# Patient Record
Sex: Female | Born: 1935 | Race: White | Hispanic: No | Marital: Married | Smoking: Former smoker
Health system: Southern US, Community
[De-identification: ages and names within clinical notes are randomized; demographics above are authoritative.]

## PROBLEM LIST (undated history)

## (undated) DIAGNOSIS — E079 Disorder of thyroid, unspecified: Secondary | ICD-10-CM

## (undated) DIAGNOSIS — T7840XA Allergy, unspecified, initial encounter: Secondary | ICD-10-CM

## (undated) DIAGNOSIS — I639 Cerebral infarction, unspecified: Secondary | ICD-10-CM

## (undated) DIAGNOSIS — I10 Essential (primary) hypertension: Secondary | ICD-10-CM

## (undated) HISTORY — DX: Essential (primary) hypertension: I10

## (undated) HISTORY — DX: Allergy, unspecified, initial encounter: T78.40XA

## (undated) HISTORY — DX: Cerebral infarction, unspecified: I63.9

## (undated) HISTORY — DX: Disorder of thyroid, unspecified: E07.9

---

## 1999-08-08 ENCOUNTER — Ambulatory Visit (HOSPITAL_COMMUNITY): Admission: RE | Admit: 1999-08-08 | Discharge: 1999-08-08 | Payer: Self-pay | Admitting: *Deleted

## 1999-09-30 ENCOUNTER — Encounter: Admission: RE | Admit: 1999-09-30 | Discharge: 1999-09-30 | Payer: Self-pay | Admitting: Orthopedic Surgery

## 2001-07-30 ENCOUNTER — Encounter: Payer: Self-pay | Admitting: Specialist

## 2001-08-06 ENCOUNTER — Inpatient Hospital Stay (HOSPITAL_COMMUNITY): Admission: RE | Admit: 2001-08-06 | Discharge: 2001-08-11 | Payer: Self-pay | Admitting: Specialist

## 2002-05-28 ENCOUNTER — Ambulatory Visit (HOSPITAL_COMMUNITY): Admission: RE | Admit: 2002-05-28 | Discharge: 2002-05-28 | Payer: Self-pay | Admitting: *Deleted

## 2002-07-17 ENCOUNTER — Encounter: Payer: Self-pay | Admitting: Specialist

## 2002-07-17 ENCOUNTER — Encounter: Admission: RE | Admit: 2002-07-17 | Discharge: 2002-07-17 | Payer: Self-pay | Admitting: *Deleted

## 2002-08-14 ENCOUNTER — Encounter: Admission: RE | Admit: 2002-08-14 | Discharge: 2002-08-14 | Payer: Self-pay | Admitting: Specialist

## 2002-08-14 ENCOUNTER — Encounter: Payer: Self-pay | Admitting: Specialist

## 2002-08-28 ENCOUNTER — Encounter: Payer: Self-pay | Admitting: Specialist

## 2002-08-28 ENCOUNTER — Encounter: Admission: RE | Admit: 2002-08-28 | Discharge: 2002-08-28 | Payer: Self-pay | Admitting: Specialist

## 2002-09-08 ENCOUNTER — Encounter: Admission: RE | Admit: 2002-09-08 | Discharge: 2002-09-08 | Payer: Self-pay | Admitting: Specialist

## 2002-09-08 ENCOUNTER — Encounter: Payer: Self-pay | Admitting: Specialist

## 2003-02-27 ENCOUNTER — Encounter: Admission: RE | Admit: 2003-02-27 | Discharge: 2003-02-27 | Payer: Self-pay | Admitting: Anesthesiology

## 2003-11-04 ENCOUNTER — Encounter: Admission: RE | Admit: 2003-11-04 | Discharge: 2003-11-04 | Payer: Self-pay | Admitting: Orthopedic Surgery

## 2004-01-12 ENCOUNTER — Inpatient Hospital Stay (HOSPITAL_COMMUNITY): Admission: RE | Admit: 2004-01-12 | Discharge: 2004-01-15 | Payer: Self-pay | Admitting: Orthopedic Surgery

## 2007-01-30 ENCOUNTER — Encounter: Admission: RE | Admit: 2007-01-30 | Discharge: 2007-01-30 | Payer: Self-pay | Admitting: Internal Medicine

## 2007-02-19 ENCOUNTER — Ambulatory Visit: Payer: Self-pay | Admitting: Vascular Surgery

## 2007-08-14 ENCOUNTER — Encounter: Admission: RE | Admit: 2007-08-14 | Discharge: 2007-08-14 | Payer: Self-pay | Admitting: Gastroenterology

## 2009-11-05 ENCOUNTER — Encounter (HOSPITAL_BASED_OUTPATIENT_CLINIC_OR_DEPARTMENT_OTHER): Admission: RE | Admit: 2009-11-05 | Discharge: 2009-12-21 | Payer: Self-pay | Admitting: Internal Medicine

## 2009-12-22 ENCOUNTER — Encounter (HOSPITAL_BASED_OUTPATIENT_CLINIC_OR_DEPARTMENT_OTHER): Admission: RE | Admit: 2009-12-22 | Discharge: 2010-03-22 | Payer: Self-pay | Admitting: Internal Medicine

## 2010-02-14 ENCOUNTER — Inpatient Hospital Stay (HOSPITAL_COMMUNITY): Admission: EM | Admit: 2010-02-14 | Discharge: 2010-02-17 | Payer: Self-pay | Admitting: Emergency Medicine

## 2010-02-15 ENCOUNTER — Encounter (INDEPENDENT_AMBULATORY_CARE_PROVIDER_SITE_OTHER): Payer: Self-pay | Admitting: Internal Medicine

## 2010-02-15 ENCOUNTER — Ambulatory Visit: Payer: Self-pay | Admitting: Physical Medicine & Rehabilitation

## 2010-02-16 ENCOUNTER — Encounter (INDEPENDENT_AMBULATORY_CARE_PROVIDER_SITE_OTHER): Payer: Self-pay | Admitting: Internal Medicine

## 2010-02-16 ENCOUNTER — Encounter (INDEPENDENT_AMBULATORY_CARE_PROVIDER_SITE_OTHER): Payer: Self-pay | Admitting: Cardiology

## 2010-02-17 ENCOUNTER — Inpatient Hospital Stay (HOSPITAL_COMMUNITY)
Admission: RE | Admit: 2010-02-17 | Discharge: 2010-03-10 | Payer: Self-pay | Admitting: Physical Medicine & Rehabilitation

## 2010-02-17 ENCOUNTER — Ambulatory Visit: Payer: Self-pay | Admitting: Physical Medicine & Rehabilitation

## 2010-04-14 ENCOUNTER — Encounter
Admission: RE | Admit: 2010-04-14 | Discharge: 2010-07-13 | Payer: Self-pay | Admitting: Physical Medicine & Rehabilitation

## 2010-04-18 ENCOUNTER — Ambulatory Visit: Payer: Self-pay | Admitting: Physical Medicine & Rehabilitation

## 2010-04-29 ENCOUNTER — Encounter
Admission: RE | Admit: 2010-04-29 | Discharge: 2010-07-28 | Payer: Self-pay | Admitting: Physical Medicine & Rehabilitation

## 2010-05-17 ENCOUNTER — Ambulatory Visit: Payer: Self-pay | Admitting: Physical Medicine & Rehabilitation

## 2010-06-14 ENCOUNTER — Ambulatory Visit: Payer: Self-pay | Admitting: Physical Medicine & Rehabilitation

## 2010-07-20 ENCOUNTER — Encounter
Admission: RE | Admit: 2010-07-20 | Discharge: 2010-07-26 | Payer: Self-pay | Admitting: Physical Medicine & Rehabilitation

## 2010-07-26 ENCOUNTER — Ambulatory Visit: Payer: Self-pay | Admitting: Physical Medicine & Rehabilitation

## 2010-07-29 ENCOUNTER — Encounter
Admission: RE | Admit: 2010-07-29 | Discharge: 2010-09-12 | Payer: Self-pay | Admitting: Physical Medicine & Rehabilitation

## 2010-10-20 ENCOUNTER — Encounter
Admission: RE | Admit: 2010-10-20 | Discharge: 2010-12-27 | Payer: Self-pay | Source: Home / Self Care | Attending: Physical Medicine & Rehabilitation | Admitting: Physical Medicine & Rehabilitation

## 2010-10-25 ENCOUNTER — Ambulatory Visit: Payer: Self-pay | Admitting: Physical Medicine & Rehabilitation

## 2010-12-22 ENCOUNTER — Encounter
Admission: RE | Admit: 2010-12-22 | Discharge: 2010-12-27 | Payer: Self-pay | Source: Home / Self Care | Attending: Physical Medicine & Rehabilitation | Admitting: Physical Medicine & Rehabilitation

## 2010-12-27 ENCOUNTER — Ambulatory Visit
Admission: RE | Admit: 2010-12-27 | Discharge: 2010-12-27 | Payer: Self-pay | Source: Home / Self Care | Attending: Physical Medicine & Rehabilitation | Admitting: Physical Medicine & Rehabilitation

## 2011-03-09 LAB — URINALYSIS, ROUTINE W REFLEX MICROSCOPIC
Bilirubin Urine: NEGATIVE
Glucose, UA: NEGATIVE mg/dL
Ketones, ur: NEGATIVE mg/dL
Leukocytes, UA: NEGATIVE
Nitrite: NEGATIVE
Protein, ur: 30 mg/dL — AB
Specific Gravity, Urine: 1.018 (ref 1.005–1.030)
Urobilinogen, UA: 0.2 mg/dL (ref 0.0–1.0)
pH: 7 (ref 5.0–8.0)

## 2011-03-09 LAB — CBC
HCT: 41.3 % (ref 36.0–46.0)
Hemoglobin: 14.3 g/dL (ref 12.0–15.0)
MCHC: 34.6 g/dL (ref 30.0–36.0)
MCV: 93.5 fL (ref 78.0–100.0)
Platelets: 209 10*3/uL (ref 150–400)
RBC: 4.42 MIL/uL (ref 3.87–5.11)
RDW: 12.6 % (ref 11.5–15.5)
WBC: 7.4 10*3/uL (ref 4.0–10.5)

## 2011-03-09 LAB — URINE MICROSCOPIC-ADD ON

## 2011-03-09 LAB — URINE CULTURE
Colony Count: NO GROWTH
Culture: NO GROWTH

## 2011-03-09 LAB — POCT CARDIAC MARKERS
CKMB, poc: 2.1 ng/mL (ref 1.0–8.0)
Myoglobin, poc: 67.1 ng/mL (ref 12–200)
Troponin i, poc: <0.05 ng/mL (ref 0.00–0.09)

## 2011-03-09 LAB — POCT I-STAT, CHEM 8
BUN: 18 mg/dL (ref 6–23)
Calcium, Ion: 1.2 mmol/L (ref 1.12–1.32)
Chloride: 102 mEq/L (ref 96–112)
Creatinine, Ser: 0.5 mg/dL (ref 0.4–1.2)
Glucose, Bld: 154 mg/dL — ABNORMAL HIGH (ref 70–99)
HCT: 42 % (ref 36.0–46.0)
Hemoglobin: 14.3 g/dL (ref 12.0–15.0)
Potassium: 3.4 mEq/L — ABNORMAL LOW (ref 3.5–5.1)
Sodium: 141 mEq/L (ref 135–145)
TCO2: 28 mmol/L (ref 0–100)

## 2011-03-09 LAB — DIFFERENTIAL
Basophils Absolute: 0 10*3/uL (ref 0.0–0.1)
Basophils Relative: 0 % (ref 0–1)
Eosinophils Absolute: 0 10*3/uL (ref 0.0–0.7)
Eosinophils Relative: 0 % (ref 0–5)
Lymphocytes Relative: 7 % — ABNORMAL LOW (ref 12–46)
Lymphs Abs: 0.5 10*3/uL — ABNORMAL LOW (ref 0.7–4.0)
Monocytes Absolute: 0.1 10*3/uL (ref 0.1–1.0)
Monocytes Relative: 1 % — ABNORMAL LOW (ref 3–12)
Neutro Abs: 6.7 10*3/uL (ref 1.7–7.7)
Neutrophils Relative %: 91 % — ABNORMAL HIGH (ref 43–77)

## 2011-03-09 LAB — PROTIME-INR
INR: 0.97 (ref 0.00–1.49)
Prothrombin Time: 12.8 seconds (ref 11.6–15.2)

## 2011-03-13 LAB — LIPID PANEL
Cholesterol: 166 mg/dL (ref 0–200)
HDL: 48 mg/dL (ref >39)
LDL Cholesterol: 102 mg/dL — ABNORMAL HIGH (ref 0–99)
Total CHOL/HDL Ratio: 3.5 RATIO
Triglycerides: 81 mg/dL (ref <150)
VLDL: 16 mg/dL (ref 0–40)

## 2011-03-13 LAB — BASIC METABOLIC PANEL
BUN: 10 mg/dL (ref 6–23)
BUN: 23 mg/dL (ref 6–23)
BUN: 24 mg/dL — ABNORMAL HIGH (ref 6–23)
CO2: 27 mEq/L (ref 19–32)
CO2: 30 mEq/L (ref 19–32)
Calcium: 9.9 mg/dL (ref 8.4–10.5)
Chloride: 102 mEq/L (ref 96–112)
Chloride: 103 mEq/L (ref 96–112)
Chloride: 108 mEq/L (ref 96–112)
Creatinine, Ser: 0.85 mg/dL (ref 0.4–1.2)
GFR calc Af Amer: >60 mL/min (ref >60)
GFR calc non Af Amer: >60 mL/min (ref >60)
Glucose, Bld: 116 mg/dL — ABNORMAL HIGH (ref 70–99)
Potassium: 3.1 mEq/L — ABNORMAL LOW (ref 3.5–5.1)
Potassium: 3.7 mEq/L (ref 3.5–5.1)
Potassium: 4.1 mEq/L (ref 3.5–5.1)
Sodium: 139 mEq/L (ref 135–145)

## 2011-03-13 LAB — DIFFERENTIAL
Basophils Absolute: 0 10*3/uL (ref 0.0–0.1)
Lymphocytes Relative: 31 % (ref 12–46)
Lymphs Abs: 2.1 10*3/uL (ref 0.7–4.0)
Monocytes Absolute: 0.6 10*3/uL (ref 0.1–1.0)
Monocytes Relative: 9 % (ref 3–12)
Neutro Abs: 3.8 10*3/uL (ref 1.7–7.7)

## 2011-03-13 LAB — CBC
HCT: 37.5 % (ref 36.0–46.0)
MCHC: 35.5 g/dL (ref 30.0–36.0)
MCV: 92.2 fL (ref 78.0–100.0)
Platelets: 174 10*3/uL (ref 150–400)
RBC: 4.06 MIL/uL (ref 3.87–5.11)
RDW: 12.3 % (ref 11.5–15.5)
WBC: 6.6 10*3/uL (ref 4.0–10.5)

## 2011-03-13 LAB — URINE MICROSCOPIC-ADD ON

## 2011-03-13 LAB — URINALYSIS, ROUTINE W REFLEX MICROSCOPIC
Bilirubin Urine: NEGATIVE
Glucose, UA: NEGATIVE mg/dL
Glucose, UA: NEGATIVE mg/dL
Hgb urine dipstick: NEGATIVE
Ketones, ur: NEGATIVE mg/dL
Ketones, ur: NEGATIVE mg/dL
Nitrite: NEGATIVE
Protein, ur: NEGATIVE mg/dL
Specific Gravity, Urine: 1.012 (ref 1.005–1.030)
Urobilinogen, UA: 0.2 mg/dL (ref 0.0–1.0)
pH: 6.5 (ref 5.0–8.0)
pH: 7 (ref 5.0–8.0)

## 2011-03-13 LAB — TSH
TSH: 0.055 u[IU]/mL — ABNORMAL LOW (ref 0.350–4.500)
TSH: 0.267 u[IU]/mL — ABNORMAL LOW (ref 0.350–4.500)

## 2011-03-13 LAB — URINE CULTURE: Colony Count: 80000

## 2011-03-13 LAB — COMPREHENSIVE METABOLIC PANEL
Albumin: 3.3 g/dL — ABNORMAL LOW (ref 3.5–5.2)
BUN: 8 mg/dL (ref 6–23)
Calcium: 8.9 mg/dL (ref 8.4–10.5)
Creatinine, Ser: 0.59 mg/dL (ref 0.4–1.2)
GFR calc Af Amer: >60 mL/min (ref >60)
Total Bilirubin: 1 mg/dL (ref 0.3–1.2)
Total Protein: 5.7 g/dL — ABNORMAL LOW (ref 6.0–8.3)

## 2011-03-13 LAB — HEMOGLOBIN A1C
Hgb A1c MFr Bld: 5.7 % (ref 4.6–6.1)
Mean Plasma Glucose: 117 mg/dL

## 2011-03-13 LAB — HOMOCYSTEINE: Homocysteine: 9.5 umol/L (ref 4.0–15.4)

## 2011-03-13 LAB — TROPONIN I: Troponin I: 0.1 ng/mL — ABNORMAL HIGH (ref 0.00–0.06)

## 2011-03-13 LAB — VITAMIN B12: Vitamin B-12: 528 pg/mL (ref 211–911)

## 2011-04-07 ENCOUNTER — Encounter: Payer: Medicare Other | Attending: Physical Medicine & Rehabilitation

## 2011-04-07 ENCOUNTER — Ambulatory Visit (HOSPITAL_BASED_OUTPATIENT_CLINIC_OR_DEPARTMENT_OTHER): Payer: Medicare Other | Admitting: Physical Medicine & Rehabilitation

## 2011-04-07 DIAGNOSIS — I69993 Ataxia following unspecified cerebrovascular disease: Secondary | ICD-10-CM | POA: Insufficient documentation

## 2011-04-07 DIAGNOSIS — I6330 Cerebral infarction due to thrombosis of unspecified cerebral artery: Secondary | ICD-10-CM

## 2011-04-07 DIAGNOSIS — N3289 Other specified disorders of bladder: Secondary | ICD-10-CM | POA: Insufficient documentation

## 2011-04-10 NOTE — Assessment & Plan Note (Signed)
CHIEF COMPLAINT:  Problems walking.  This is a 77 year old female who had a right cerebellar stroke on February 14, 2010, right hemiataxia chronic, last evaluated by myself on December 27, 2010.  She has completed inpatient rehab, home health rehab, and outpatient therapy.  She is pursuing a motorized scooter.  She still needs assist with meal prep, shopping, bathing, dressing, and toileting. She is retired, lives with her husband with her daughter that assists her.  Blood pressures have been mainly in 150 range.  Her husband showed me this, states that he discussed this with Dr. Jacky Kindle, but no change in treatment plan were pursued.  Blood pressure 151/67, pulse 50, respirations 18, and sat 96% on room air.  This is an elderly female in no acute distress, orientation x3. Her speech is mildly dysarthric with some ataxia, dysarthria in right upper extremity, moderate dysmetria finger-nose-finger.  Motor strength is 5- in the right deltoid biceps, triceps, and grip and on the left 5/5.  In the lower extremity, she is 4-/5 strength.  She has dysmetria in the right lower extremity.  Sensation is intact.  Orientation to person, place, and year, but not month.  IMPRESSION: 1. Cerebellar stroke right sided, residual right hemiataxia, truncal     ataxia.  She has plateaued. 2. Spastic bladder.  Ditropan b.i.d.  We will increase to t.i.d. due     to occasional incontinent episodes.  I will see her back in about 4     months.     Erick Colace, M.D. Electronically Signed    AEK/MedQ D:  04/07/2011 17:23:27  T:  04/08/2011 06:24:45  Job #:  [id number]  cc:   Geoffry Paradise, M.D. Fax: [phone]

## 2011-05-05 NOTE — Discharge Summary (Signed)
NAME:  Jillian Gay, Jillian Gay                         ACCOUNT NO.:  [id number]   MEDICAL RECORD NO.:  [id number]                   PATIENT TYPE:  INP   LOCATION:  0477                                 FACILITY:  [facility]   PHYSICIAN:  Madlyn Frankel. Charlann Boxer, M.D.               DATE OF BIRTH:  1932/12/14   DATE OF ADMISSION:  01/12/2004  DATE OF DISCHARGE:  01/15/2004                                 DISCHARGE SUMMARY   ADMISSION DIAGNOSES:  1. Osteoarthritis of the left knee in the medial compartment.  2. Hypothyroidism.  3. Anxiety.   DISCHARGE DIAGNOSES:  1. Osteoarthritis of the left knee in the medial compartment, status post     left unicompartmental knee replacement.  2. Hypothyroidism.  3. Anxiety.  4. Postoperative hemorrhage anemia, stable at the time of discharge.   PROCEDURES:  The patient was taken to the operating room on January 12, 2004, and underwent a left unicompartmental knee replacement in the medial  compartment.  Surgeon was Dr. Durene Romans.  Assistant was [employer],  P.A.-C.  Surgery was done under general anesthesia, and a Hemovac drain x1  was placed at the time of surgery.   CONSULTS:  1. Physical therapy.  2. Occupational therapy.  3. Social work case Insurance account manager.   BRIEF HISTORY:  The patient is a 77 year old female with a long-standing  history of left knee pain.  She had previously undergone a right total knee  replacement in August of 2002.  She had some continued pain with the right  total knee; however, she is at the point now where her left knee is worse  than her right knee, and she is constantly in pain.  She has already been on  pain medication.  She is having difficulty getting around and doing her  activities of daily living.  X-rays revealed bone-on-bone arthritis in the  medial compartment of the left knee.  Dr. Charlann Boxer felt it was best at this  point to proceed with an unicompartmental knee arthroplasty.  The patient  agrees.  Risks and benefits of  the surgery were discussed with the patient,  and the patient wished to proceed.   LABORATORY DATA:  CBC on admission showed a hemoglobin of 14.3, hematocrit  41.6, white blood cell count 5.8, red blood cell count 4.47.  Serial  hemoglobins and hematocrits were followed throughout the hospital stay.  Hemoglobin and hematocrit did decline to 11.9 and 34.5 on January 15, 2004,  but were stable at the time of discharge.  Differential on admission was all  within normal limits.  Coagulation studies on admission were all within  normal limits.  PT and INR were 14.4 and 1.2 respectively on Coumadin  therapy.  Routine chemistry on admission showed glucose high at 120.  Serial  chemistries were followed throughout the hospital stay.  Glucose did range  from 120 on January 06, 2004, to a high of  150 on January 13, 2004.  Urinalysis on admission was all within normal limits.  The patient's blood  type is O negative with antibody screen negative.  EKG on admission showed  normal sinus rhythm.  I do not see any preoperative knee x-rays or chest x-  ray.   HOSPITAL COURSE:  The patient was admitted to the [facility] and  taken to the operating room where she underwent the above-stated procedure  without complications.  The patient tolerated the procedure well, and was  taken to the recovery room and the orthopedic floor to continue  postoperative care.   On postoperative day #1, January 13, 2004, the patient complained of pain in  the left knee, as expected.  Hemoglobin and hematocrit were 12.8 and 37.5.  She was neurovascularly intact to the left lower extremity.  She had a T-max  of 101.3.  Dressing was clean, dry, and intact.  The patient continued  working with physical therapy and occupational therapy.  Hemoglobin was  discontinued on this day.  PCA was also discontinued on this day, and IV was  heplocked.   On postoperative day #2, January 14, 2004, the patient was doing okay.   The  left knee incision was clean, dry, and intact.  The patient was to continue  working with physical therapy and occupational therapy, for planned  discharge the following day with home health physical therapy.   On postoperative day #3, January 15, 2004, the patient was doing well, and  was ready for discharge.  Hemoglobin and hematocrit were 11.9 and 34.5.  The  incision was clean, dry, and intact.  She was neurovascularly intact to the  left lower extremity.  T max was 99.8.  The patient was discharged home on  this date.   DISPOSITION:  The patient was discharged home on January 15, 2004.   DISCHARGE MEDICATIONS:  1. Vicodin, #50, 1-2 p.o. q.4-6h. p.r.n. pain.  2. Robaxin 500 mg, #50, one p.o. q.6-8h. p.r.n. spasm.  3. Coumadin per pharmacy protocol.   DIET:  As tolerated.   ACTIVITY:  The patient is weightbearing as tolerated to the left lower  extremity.  [facility] for home care.  Total knee precautions.   WOUND CARE:  The patient is to perform daily dressing changes until no  drainage.  She may shower on postoperative day #5, or when no drainage,  whichever is later.   FOLLOW UP:  The patient is to follow up with Dr. Charlann Boxer 2 weeks from the day  of surgery.  The office should be called for an appointment at [phone].   CONDITION ON DISCHARGE:  Stable and improved.     Clarene Reamer, P.A.-C.                   Madlyn Frankel Charlann Boxer, M.D.    SW/MEDQ  D:  02/05/2004  T:  02/06/2004  Job:  [id number]

## 2011-05-05 NOTE — Op Note (Signed)
NAME:  Jillian Gay, Jillian Gay                         ACCOUNT NO.:  [id number]   MEDICAL RECORD NO.:  [id number]                   PATIENT TYPE:  INP   LOCATION:  X003                                 FACILITY:  [facility]   PHYSICIAN:  Madlyn Frankel. Charlann Boxer, M.D.               DATE OF BIRTH:  1931/12/29   DATE OF PROCEDURE:  01/12/2004  DATE OF DISCHARGE:                                 OPERATIVE REPORT   PREOPERATIVE DIAGNOSIS:  Left knee medial compartment osteoarthritis.   POSTOPERATIVE DIAGNOSIS:  Left knee medial compartment osteoarthritis.   PROCEDURE:  Left knee unicompartmental knee replacement.   COMPONENTS USED:  Biomet Oxford knee system with a size medium femur, a size  left medium tibial tray, a size 3 meniscal bearing polyethylene insert.   SURGEON:  Madlyn Frankel. Charlann Boxer, M.D.   ASSISTANT:  Clarene Reamer, P.A.-C.   ANESTHESIA:  General.   ESTIMATED BLOOD LOSS:  Minimal.   TOURNIQUET TIME:  100 minutes.   DRAINS:  x1.   COMPLICATIONS:  None apparent.   INDICATION FOR PROCEDURE:  Ms. Augustine is a 77 year old white female who is  followed in the office for left knee pain associated with medial compartment  degenerative changes.  After reviewing extensively her disease pathology and  obtaining stress radiographs and reviewing with her procedural options, she  consents for left knee unicompartmental knee arthroplasty.   PROCEDURE IN DETAIL:  The patient was brought to the operative theater.  Once adequate anesthesia and preoperative antibiotics were administered, the  patient was positioned supine.  The right leg, the nonoperative leg, was  positioned into a gynecologic stirrup and abducted and flexed out of the  way.  The left lower extremity was then placed in the leg holder with the  foot of the bed dropped.  The left lower extremity was then prepped and  draped in sterile fashion.  The leg was exsanguinated, the tourniquet  elevated.  A paramidline incision was made extending  from the superior pole  of the patella to the level of the tibial tubercle.  A medial parapatellar  arthrotomy was made at that level as well.  Medial soft tissues were  stripped off of the proximal and medial anterior aspect of the tibia to  allow for placement of tibial cutting jig.  Following exposure, osteophytes  were removed off the medial femoral condyle as well as off of the  intercondylar notch region.  At this point a proximal tibial cut was made  using the extramedullary guide, first using a reciprocating saw in the level  of the notch, aimed toward the femoral head and about one-third or halfway  up the intercondylar spine.  This was followed by an oscillating saw that  removed a wafer of bone that measured about 2-3 mm in depth.  This wafer of  bone was then sized and determined to be a size medium.  At this point the  posterior horn of the medial meniscus was excised.  At this point the  femoral rod was placed into the femoral canal per protocol.  This would be  used as a guide for position of later jigs.  The femoral preparation was  then carried out with the tibial tray and femoral cutting jig.  Drill holes  were placed, assuring that it was parallel to the intramedullary rod,  parallel to the tibial cut surface, and well-positioned in the medial and  lateral plane of the medial femoral condyle.  Once this was confirmed, the  drill holes were made.  At this point the posterior femoral cut was made  with the jig.  Further debridement of posterior soft tissues was carried out  at this point.  Following this initial milling was carried out with a 0  spigot.  Following this the trial components were placed and with the knee  in 90 degrees of flexion, the flexion gap was noted to be a size 3 with the  knee in 20 degrees of flexion and was noted to be a size 1 gap.  At this  point based on the protocol, a size 2 spigot was placed and milling  repeated.  Bony prominences were  debrided as per protocol using an  osteotome.  A trial reduction was then carried out with a medium tibia,  medium femur, and size 3 poly.  The knee came to full extension in flexion  with good stability and preservation of the ligaments.  At this point trial  components were removed.  The knee was copiously irrigated with normal  saline solution.  Final components were brought to the field.  The final  components were cemented in in a two-cement technique, first with the tibial  component.  Note that prior to this the final tibial preparation was carried  out using the reciprocating saw and the trial to allow for the tibial keel.  This tibial component was cemented into position with the trial femoral  component and the trial poly placed with the knee held in 45 degrees  flexion.  Excessive cement was removed.  Once the cement had cured, a second  batch of cement was mixed for the femoral component.  The femoral component  was cemented into position, excessive cement removed, and the knee was held  again at 45 degrees and pressure applied.  Once the cement had cured, the  excessive cement was removed and the knee was re-examined for any foreign  body and a final size 3 meniscal bearing prosthesis then inserted into the  knee.  The knee came to full extension in flexion at 120, limited by the  knee and leg holder.  The knee was again irrigated, a medium Hemovac was  placed deep, and the extensor mechanism was reapproximated using #1 PDS,  followed by 2-0 Vicryl and a 4-0 Monocryl.  The patient tolerated the  procedure without complications.  The wound was dressed sterilely with Steri-  Strips, dressing sponges, and a bulky dressing.                                               Madlyn Frankel Charlann Boxer, M.D.    MDO/MEDQ  D:  01/12/2004  T:  01/12/2004  Job:  [id number]

## 2011-05-05 NOTE — H&P (Signed)
NAME:  Jillian Gay, Jillian Gay                         ACCOUNT NO.:  [id number]   MEDICAL RECORD NO.:  [id number]                   PATIENT TYPE:  INP   LOCATION:  0477                                 FACILITY:  [facility]   PHYSICIAN:  Madlyn Frankel. Charlann Boxer, M.D.               DATE OF BIRTH:  1932-02-08   DATE OF ADMISSION:  01/12/2004  DATE OF DISCHARGE:                                HISTORY & PHYSICAL   CHIEF COMPLAINT:  Left knee pain.   HISTORY OF PRESENT ILLNESS:  The patient is a 77 year old female with a long  history of left knee pain.  She had previously undergone a right total knee  replacement back in August of 2002.  She has had some continued pain with  her right total knee arthroplasty.  However, she is at the point now where  her left knee is now worse than her right and she is constantly in pain.  She had already been on pain medication and she is having a difficult time  getting around and doing her activities of daily living.  X-rays revealed a  bone-on-bone arthritis in the medial compartment of her left knee.  Dr. Charlann Boxer  feels that at this point it is best to proceed with a unicompartmental knee  replacement from the x-ray findings. The patient agrees.  Risks and benefits  of the surgery have been discussed with the patient and the patient wishes  to proceed.   PAST MEDICAL HISTORY:  Hypothyroidism and anxiety.   PAST SURGICAL HISTORY:  1. A right total knee arthroplasty.  2. Back surgery.  3. Neck surgery x2.  4. Vaginal reconstruction.  5. Hemorrhoidectomy.   MEDICATIONS:  1. Neurontin 300 mg one p.o. q.i.d.  2. Synthroid 0.125 mg one p.o. daily.  3. Vicodin as needed.  4. Valium 5 mg 1/2 p.o. q.a.m. and 1 p.o. q.h.s.  5. Multivitamin.   ALLERGIES:  MACRODANTIN caused a stroke, CELEBREX causes hives.   SOCIAL HISTORY:  The patient denies any tobacco or alcohol use.  She is  married.  She lives in a one-story house with 3 steps entering her house and  her husband will  be her caregiver after surgery.   FAMILY HISTORY:  Father deceased with coronary artery disease and diabetes  mellitus.  Brother with cancer.   REVIEW OF SYSTEMS:  GENERAL:  Denies fever, chills, night sweats, bleeding  tendencies.  CENTRAL NERVOUS SYSTEM:  Denies blurry or double vision,  seizures, headaches, paralysis.  RESPIRATORY:  Denies shortness of breath  with exertion.  Denies cough, hemoptysis.  CARDIOVASCULAR:  Denies chest  pain, angina, orthopnea.  GI:  Denies nausea, vomiting, diarrhea,  constipation, melena, bloody stools.  GU:  Denies dysuria, hematuria, or  discharge.  MUSCULOSKELETAL:  As pertinent to the HPI.   PHYSICAL EXAMINATION:  VITAL SIGNS:  Temperature 98.1, pulse 60,  respirations 16, blood pressure 158/80.  GENERAL:  A well-developed,  well-nourished 77 year old female.  HEENT:  Normocephalic, atraumatic.  Pupils equal, round, reactive to light.  NECK:  Supple.  No carotid bruit noted.  CHEST:  Clear to auscultation bilaterally.  No wheezes or crackles.  HEART:  Regular rate and rhythm.  No murmurs, rubs, or gallops.  ABDOMEN:  Soft, nontender, nondistended.  Positive bowel sounds x4.  EXTREMITIES:  Painful range of motion of the left knee.  She has near full  extension, flexion of 130 degrees.  Her ligaments are intact in the left  knee and she is neurovascularly intact to the left lower extremity.  SKIN:  No rashes or lesion.   X-ray reveals a bone-on-bone on the medial compartment of the left knee.   IMPRESSION:  1. Osteoarthritis of the left knee and the medial compartment.  2. Hypothyroidism.  3. Anxiety.   PLAN:  The patient will be admitted to [facility] on January 12, 2004 to undergo a left unicompartmental knee replacement by Durene Romans,  M.D.     Clarene Reamer, P.A.-C.                   Madlyn Frankel Charlann Boxer, M.D.    SW/MEDQ  D:  01/14/2004  T:  01/14/2004  Job:  [id number]   cc:   Geoffry Paradise, M.D.  [address]   [place]  [place] [address]  Fax: [phone]

## 2011-08-04 ENCOUNTER — Encounter: Payer: Medicare Other | Attending: Physical Medicine & Rehabilitation

## 2011-08-04 ENCOUNTER — Ambulatory Visit (HOSPITAL_BASED_OUTPATIENT_CLINIC_OR_DEPARTMENT_OTHER): Payer: Medicare Other | Admitting: Physical Medicine & Rehabilitation

## 2011-08-04 DIAGNOSIS — N319 Neuromuscular dysfunction of bladder, unspecified: Secondary | ICD-10-CM | POA: Insufficient documentation

## 2011-08-04 DIAGNOSIS — I69993 Ataxia following unspecified cerebrovascular disease: Secondary | ICD-10-CM | POA: Insufficient documentation

## 2011-08-04 DIAGNOSIS — I69920 Aphasia following unspecified cerebrovascular disease: Secondary | ICD-10-CM | POA: Insufficient documentation

## 2011-08-04 DIAGNOSIS — R42 Dizziness and giddiness: Secondary | ICD-10-CM | POA: Insufficient documentation

## 2011-08-04 NOTE — Assessment & Plan Note (Signed)
Jillian Gay is a 77 year old female who is an inpatient on the Stroke Service at [facility] for cerebellar stroke about a year and half ago.  She had severe right hemi ataxia and vertigo.  She still has dizziness with position changes, but not at rest.  She is followed up with Dr. Bernadene Person, who provides her primary care.  She has had no new medical problems since I last saw her in April of this year.  She has been on Ditropan for her bladder.  She tried going up to t.i.d. but this caused her to urinate too infrequently and now is voiding 4 times a day with a b.i.d. dose of Ditropan and no recent UTIs.  She has no new medications reported.  She is on Synthroid, lisinopril, Plavix, Zocor, calcium, vitamin D.  PHYSICAL EXAMINATION:  MUSCULOSKELETAL:  Ataxia finger-nose-finger on the right side as well, but only mild ataxia on heel-to-shin on the right side.  Left side without any impairment.  She has visual fields intact with confrontation testing.  Extraocular muscles are intact.  She does have mild nystagmus bilaterally horizontal. EXTREMITIES:  Lower extremities without edema. NEUROLOGIC:  Speech without any aphasia.  Mildly ataxic dysarthria.  IMPRESSION: 1. Cerebrovascular accident, right hemiataxia, truncal ataxia right     cerebellar infarct. 2. Spastic neurogenic bladder.  PLAN: 1. We will continue her Ditropan on b.i.d. basis. 2. I will continue home exercise program.  Family has been very     compliant in continuing with her home exercise program on a very     rigidly scheduled basis.  I will see the patient back.     Erick Colace, M.D. Electronically Signed    AEK/MedQ D:  08/04/2011 16:39:24  T:  08/04/2011 21:08:08  Job #:  [id number]

## 2012-02-06 ENCOUNTER — Ambulatory Visit (HOSPITAL_BASED_OUTPATIENT_CLINIC_OR_DEPARTMENT_OTHER): Payer: Medicare Other | Admitting: Physical Medicine & Rehabilitation

## 2012-02-06 ENCOUNTER — Encounter: Payer: Self-pay | Admitting: Physical Medicine & Rehabilitation

## 2012-02-06 ENCOUNTER — Encounter: Payer: Medicare Other | Attending: Physical Medicine & Rehabilitation

## 2012-02-06 VITALS — BP 144/40 | HR 60 | Resp 18 | Ht 65.0 in | Wt 189.0 lb

## 2012-02-06 DIAGNOSIS — I10 Essential (primary) hypertension: Secondary | ICD-10-CM | POA: Insufficient documentation

## 2012-02-06 DIAGNOSIS — R269 Unspecified abnormalities of gait and mobility: Secondary | ICD-10-CM | POA: Insufficient documentation

## 2012-02-06 DIAGNOSIS — Z79899 Other long term (current) drug therapy: Secondary | ICD-10-CM | POA: Insufficient documentation

## 2012-02-06 DIAGNOSIS — I69993 Ataxia following unspecified cerebrovascular disease: Secondary | ICD-10-CM

## 2012-02-06 DIAGNOSIS — N319 Neuromuscular dysfunction of bladder, unspecified: Secondary | ICD-10-CM | POA: Insufficient documentation

## 2012-02-06 DIAGNOSIS — I69393 Ataxia following cerebral infarction: Secondary | ICD-10-CM | POA: Insufficient documentation

## 2012-02-06 DIAGNOSIS — E039 Hypothyroidism, unspecified: Secondary | ICD-10-CM | POA: Insufficient documentation

## 2012-02-06 DIAGNOSIS — E785 Hyperlipidemia, unspecified: Secondary | ICD-10-CM | POA: Insufficient documentation

## 2012-02-06 DIAGNOSIS — R5381 Other malaise: Secondary | ICD-10-CM | POA: Insufficient documentation

## 2012-02-06 NOTE — Progress Notes (Signed)
  Subjective:    Patient ID: Jillian Gay, female    DOB: 11-28-1932, 78 y.o.   MRN: [id number]  Cerebrovascular Accident This is a chronic problem. The current episode started more than 1 year ago. Associated symptoms include weakness. The symptoms are aggravated by nothing. The treatment provided mild relief.  Larey Seat out of bed, January 21st 2013. The patient has been through inpatient rehabilitation as well as outpatient rehabilitation from her stroke. She has plateaued in her functional status at wheelchair level. Her husband needs to help her on and off the toilet. She is nonambulatory but assist with transfers. She requires help with dressing and bathing. No new medical issues since August 2012.    Review of Systems  HENT: Negative.   Eyes: Negative.   Respiratory: Negative.   Cardiovascular: Negative.   Gastrointestinal: Negative.   Genitourinary: Positive for frequency.  Musculoskeletal: Negative.   Skin: Negative.   Neurological: Positive for weakness.  Hematological: Negative.   Psychiatric/Behavioral: Negative.        Objective:   Physical Exam  Constitutional: She appears well-developed and well-nourished.  HENT:  Head: Normocephalic.  Musculoskeletal:       Right shoulder: She exhibits decreased strength. She exhibits normal range of motion, no tenderness, no swelling, no deformity and no pain.       Left shoulder: She exhibits normal range of motion.  Neurological: She has normal strength and normal reflexes. No sensory deficit. She exhibits abnormal muscle tone. Coordination and gait abnormal.  Reflex Scores:      Tricep reflexes are 2+ on the right side and 2+ on the left side.      Bicep reflexes are 2+ on the right side and 2+ on the left side.      Brachioradialis reflexes are 2+ on the right side and 2+ on the left side.      Patellar reflexes are 2+ on the right side and 2+ on the left side.      Achilles reflexes are 2+ on the right side and 2+ on the  left side.         Assessment & Plan:  1.  R cerebellar infarct with residual ataxia no further PT/OT, cont HEP, f/u PM&R prn 2.  Neurogenic spastic bladder continue current dose ditropan and increase toileting to q 4 hrs 3. Hypertension on lisinopril 4. Hypothyroidism on Synthroid  5. Hyperlipidemia on Zocor

## 2012-02-06 NOTE — Patient Instructions (Signed)
Continue toileting every 4-5 hrs.  Continue ditropan twice a day.

## 2017-09-18 ENCOUNTER — Other Ambulatory Visit: Payer: Self-pay | Admitting: Internal Medicine

## 2017-09-18 DIAGNOSIS — R195 Other fecal abnormalities: Secondary | ICD-10-CM

## 2017-09-21 ENCOUNTER — Other Ambulatory Visit: Payer: Self-pay

## 2017-09-25 ENCOUNTER — Ambulatory Visit
Admission: RE | Admit: 2017-09-25 | Discharge: 2017-09-25 | Disposition: A | Discharge status: Home / Self Care | Payer: Medicare Other | Source: Ambulatory Visit | Attending: Internal Medicine | Admitting: Internal Medicine

## 2017-09-25 DIAGNOSIS — R195 Other fecal abnormalities: Secondary | ICD-10-CM

## 2017-09-25 MED ORDER — IOPAMIDOL (ISOVUE-300) INJECTION 61%
100.0000 mL | Freq: Once | INTRAVENOUS | Status: AC | PRN
  Administered 2017-09-25: 100 mL via INTRAVENOUS

## 2019-11-10 ENCOUNTER — Other Ambulatory Visit: Payer: Self-pay

## 2019-11-10 ENCOUNTER — Ambulatory Visit
Admission: RE | Admit: 2019-11-10 | Discharge: 2019-11-10 | Disposition: A | Discharge status: Home / Self Care | Payer: Medicare Other | Source: Ambulatory Visit | Attending: Internal Medicine | Admitting: Internal Medicine

## 2019-11-10 ENCOUNTER — Other Ambulatory Visit: Payer: Self-pay | Admitting: Internal Medicine

## 2019-11-10 DIAGNOSIS — M545 Low back pain, unspecified: Secondary | ICD-10-CM

## 2019-12-05 ENCOUNTER — Encounter (HOSPITAL_COMMUNITY): Payer: Self-pay | Admitting: Emergency Medicine

## 2019-12-05 ENCOUNTER — Inpatient Hospital Stay (HOSPITAL_COMMUNITY): Payer: Medicare Other

## 2019-12-05 ENCOUNTER — Other Ambulatory Visit: Payer: Self-pay

## 2019-12-05 ENCOUNTER — Inpatient Hospital Stay (HOSPITAL_COMMUNITY)
Admission: EM | Admit: 2019-12-05 | Discharge: 2019-12-15 | DRG: 064 | Disposition: A | Discharge status: Hospice | Payer: Medicare Other | Attending: Internal Medicine | Admitting: Internal Medicine

## 2019-12-05 ENCOUNTER — Emergency Department (HOSPITAL_COMMUNITY): Payer: Medicare Other

## 2019-12-05 DIAGNOSIS — I63321 Cerebral infarction due to thrombosis of right anterior cerebral artery: Principal | ICD-10-CM | POA: Diagnosis present

## 2019-12-05 DIAGNOSIS — G9341 Metabolic encephalopathy: Secondary | ICD-10-CM | POA: Diagnosis present

## 2019-12-05 DIAGNOSIS — I6330 Cerebral infarction due to thrombosis of unspecified cerebral artery: Secondary | ICD-10-CM | POA: Diagnosis not present

## 2019-12-05 DIAGNOSIS — I361 Nonrheumatic tricuspid (valve) insufficiency: Secondary | ICD-10-CM | POA: Diagnosis not present

## 2019-12-05 DIAGNOSIS — E876 Hypokalemia: Secondary | ICD-10-CM | POA: Diagnosis not present

## 2019-12-05 DIAGNOSIS — R4182 Altered mental status, unspecified: Secondary | ICD-10-CM | POA: Diagnosis present

## 2019-12-05 DIAGNOSIS — I639 Cerebral infarction, unspecified: Secondary | ICD-10-CM

## 2019-12-05 DIAGNOSIS — Z888 Allergy status to other drugs, medicaments and biological substances status: Secondary | ICD-10-CM

## 2019-12-05 DIAGNOSIS — R29810 Facial weakness: Secondary | ICD-10-CM | POA: Diagnosis present

## 2019-12-05 DIAGNOSIS — E785 Hyperlipidemia, unspecified: Secondary | ICD-10-CM | POA: Diagnosis present

## 2019-12-05 DIAGNOSIS — Z886 Allergy status to analgesic agent status: Secondary | ICD-10-CM

## 2019-12-05 DIAGNOSIS — Z515 Encounter for palliative care: Secondary | ICD-10-CM | POA: Diagnosis not present

## 2019-12-05 DIAGNOSIS — I340 Nonrheumatic mitral (valve) insufficiency: Secondary | ICD-10-CM | POA: Diagnosis not present

## 2019-12-05 DIAGNOSIS — I69398 Other sequelae of cerebral infarction: Secondary | ICD-10-CM | POA: Diagnosis not present

## 2019-12-05 DIAGNOSIS — F0150 Vascular dementia without behavioral disturbance: Secondary | ICD-10-CM | POA: Diagnosis present

## 2019-12-05 DIAGNOSIS — N390 Urinary tract infection, site not specified: Secondary | ICD-10-CM | POA: Diagnosis present

## 2019-12-05 DIAGNOSIS — Z20828 Contact with and (suspected) exposure to other viral communicable diseases: Secondary | ICD-10-CM | POA: Diagnosis present

## 2019-12-05 DIAGNOSIS — E669 Obesity, unspecified: Secondary | ICD-10-CM | POA: Diagnosis present

## 2019-12-05 DIAGNOSIS — Z7902 Long term (current) use of antithrombotics/antiplatelets: Secondary | ICD-10-CM

## 2019-12-05 DIAGNOSIS — R1310 Dysphagia, unspecified: Secondary | ICD-10-CM | POA: Diagnosis present

## 2019-12-05 DIAGNOSIS — G9389 Other specified disorders of brain: Secondary | ICD-10-CM | POA: Diagnosis present

## 2019-12-05 DIAGNOSIS — I69320 Aphasia following cerebral infarction: Secondary | ICD-10-CM

## 2019-12-05 DIAGNOSIS — R404 Transient alteration of awareness: Secondary | ICD-10-CM | POA: Diagnosis not present

## 2019-12-05 DIAGNOSIS — I69351 Hemiplegia and hemiparesis following cerebral infarction affecting right dominant side: Secondary | ICD-10-CM

## 2019-12-05 DIAGNOSIS — I4891 Unspecified atrial fibrillation: Secondary | ICD-10-CM | POA: Diagnosis present

## 2019-12-05 DIAGNOSIS — M545 Low back pain: Secondary | ICD-10-CM | POA: Diagnosis not present

## 2019-12-05 DIAGNOSIS — Z883 Allergy status to other anti-infective agents status: Secondary | ICD-10-CM

## 2019-12-05 DIAGNOSIS — B952 Enterococcus as the cause of diseases classified elsewhere: Secondary | ICD-10-CM | POA: Diagnosis present

## 2019-12-05 DIAGNOSIS — M4854XA Collapsed vertebra, not elsewhere classified, thoracic region, initial encounter for fracture: Secondary | ICD-10-CM | POA: Diagnosis present

## 2019-12-05 DIAGNOSIS — Z87891 Personal history of nicotine dependence: Secondary | ICD-10-CM

## 2019-12-05 DIAGNOSIS — E039 Hypothyroidism, unspecified: Secondary | ICD-10-CM | POA: Diagnosis present

## 2019-12-05 DIAGNOSIS — I10 Essential (primary) hypertension: Secondary | ICD-10-CM | POA: Diagnosis present

## 2019-12-05 DIAGNOSIS — Z6834 Body mass index (BMI) 34.0-34.9, adult: Secondary | ICD-10-CM

## 2019-12-05 DIAGNOSIS — Z7401 Bed confinement status: Secondary | ICD-10-CM

## 2019-12-05 DIAGNOSIS — E119 Type 2 diabetes mellitus without complications: Secondary | ICD-10-CM | POA: Diagnosis present

## 2019-12-05 DIAGNOSIS — R471 Dysarthria and anarthria: Secondary | ICD-10-CM | POA: Diagnosis present

## 2019-12-05 DIAGNOSIS — Z993 Dependence on wheelchair: Secondary | ICD-10-CM

## 2019-12-05 DIAGNOSIS — I69319 Unspecified symptoms and signs involving cognitive functions following cerebral infarction: Secondary | ICD-10-CM | POA: Diagnosis not present

## 2019-12-05 DIAGNOSIS — R29722 NIHSS score 22: Secondary | ICD-10-CM | POA: Diagnosis not present

## 2019-12-05 DIAGNOSIS — R29721 NIHSS score 21: Secondary | ICD-10-CM | POA: Diagnosis not present

## 2019-12-05 DIAGNOSIS — Z7189 Other specified counseling: Secondary | ICD-10-CM | POA: Diagnosis not present

## 2019-12-05 DIAGNOSIS — Z66 Do not resuscitate: Secondary | ICD-10-CM | POA: Diagnosis present

## 2019-12-05 DIAGNOSIS — Z79890 Hormone replacement therapy (postmenopausal): Secondary | ICD-10-CM

## 2019-12-05 DIAGNOSIS — Z79899 Other long term (current) drug therapy: Secondary | ICD-10-CM

## 2019-12-05 DIAGNOSIS — S22080S Wedge compression fracture of T11-T12 vertebra, sequela: Secondary | ICD-10-CM | POA: Diagnosis not present

## 2019-12-05 LAB — COMPREHENSIVE METABOLIC PANEL
ALT: 23 U/L (ref 0–44)
AST: 26 U/L (ref 15–41)
Albumin: 2.8 g/dL — ABNORMAL LOW (ref 3.5–5.0)
Alkaline Phosphatase: 44 U/L (ref 38–126)
Anion gap: 9 (ref 5–15)
BUN: 16 mg/dL (ref 8–23)
CO2: 24 mmol/L (ref 22–32)
Calcium: 8.3 mg/dL — ABNORMAL LOW (ref 8.9–10.3)
Chloride: 113 mmol/L — ABNORMAL HIGH (ref 98–111)
Creatinine, Ser: 1.03 mg/dL — ABNORMAL HIGH (ref 0.44–1.00)
GFR calc Af Amer: 57 mL/min — ABNORMAL LOW (ref >60)
GFR calc non Af Amer: 50 mL/min — ABNORMAL LOW (ref >60)
Glucose, Bld: 128 mg/dL — ABNORMAL HIGH (ref 70–99)
Potassium: 3.6 mmol/L (ref 3.5–5.1)
Sodium: 146 mmol/L — ABNORMAL HIGH (ref 135–145)
Total Bilirubin: 0.8 mg/dL (ref 0.3–1.2)
Total Protein: 5.1 g/dL — ABNORMAL LOW (ref 6.5–8.1)

## 2019-12-05 LAB — POCT I-STAT EG7
Acid-Base Excess: 4 mmol/L — ABNORMAL HIGH (ref 0.0–2.0)
Bicarbonate: 29.2 mmol/L — ABNORMAL HIGH (ref 20.0–28.0)
Calcium, Ion: 1.17 mmol/L (ref 1.15–1.40)
HCT: 35 % — ABNORMAL LOW (ref 36.0–46.0)
Hemoglobin: 11.9 g/dL — ABNORMAL LOW (ref 12.0–15.0)
O2 Saturation: 97 %
Potassium: 3.8 mmol/L (ref 3.5–5.1)
Sodium: 144 mmol/L (ref 135–145)
TCO2: 31 mmol/L (ref 22–32)
pCO2, Ven: 46.7 mmHg (ref 44.0–60.0)
pH, Ven: 7.404 (ref 7.250–7.430)
pO2, Ven: 93 mmHg — ABNORMAL HIGH (ref 32.0–45.0)

## 2019-12-05 LAB — URINALYSIS, ROUTINE W REFLEX MICROSCOPIC
Bilirubin Urine: NEGATIVE
Glucose, UA: NEGATIVE mg/dL
Hgb urine dipstick: NEGATIVE
Ketones, ur: NEGATIVE mg/dL
Nitrite: NEGATIVE
Protein, ur: NEGATIVE mg/dL
Specific Gravity, Urine: 1.013 (ref 1.005–1.030)
pH: 6 (ref 5.0–8.0)

## 2019-12-05 LAB — CBC WITH DIFFERENTIAL/PLATELET
Abs Immature Granulocytes: 0.02 10*3/uL (ref 0.00–0.07)
Basophils Absolute: 0 10*3/uL (ref 0.0–0.1)
Basophils Relative: 0 %
Eosinophils Absolute: 0.1 10*3/uL (ref 0.0–0.5)
Eosinophils Relative: 1 %
HCT: 38.4 % (ref 36.0–46.0)
Hemoglobin: 12.2 g/dL (ref 12.0–15.0)
Immature Granulocytes: 0 %
Lymphocytes Relative: 16 %
Lymphs Abs: 1 10*3/uL (ref 0.7–4.0)
MCH: 32.8 pg (ref 26.0–34.0)
MCHC: 31.8 g/dL (ref 30.0–36.0)
MCV: 103.2 fL — ABNORMAL HIGH (ref 80.0–100.0)
Monocytes Absolute: 0.6 10*3/uL (ref 0.1–1.0)
Monocytes Relative: 9 %
Neutro Abs: 4.6 10*3/uL (ref 1.7–7.7)
Neutrophils Relative %: 74 %
Platelets: 214 10*3/uL (ref 150–400)
RBC: 3.72 MIL/uL — ABNORMAL LOW (ref 3.87–5.11)
RDW: 14.4 % (ref 11.5–15.5)
WBC: 6.3 10*3/uL (ref 4.0–10.5)
nRBC: 0 % (ref 0.0–0.2)

## 2019-12-05 LAB — CBG MONITORING, ED: Glucose-Capillary: 122 mg/dL — ABNORMAL HIGH (ref 70–99)

## 2019-12-05 LAB — SARS CORONAVIRUS 2 (TAT 6-24 HRS): SARS Coronavirus 2: NEGATIVE

## 2019-12-05 LAB — VITAMIN B12: Vitamin B-12: 528 pg/mL (ref 180–914)

## 2019-12-05 LAB — TSH: TSH: 6.162 u[IU]/mL — ABNORMAL HIGH (ref 0.350–4.500)

## 2019-12-05 MED ORDER — SODIUM CHLORIDE 0.9 % IV SOLN: INTRAVENOUS | Status: AC

## 2019-12-05 MED ORDER — SODIUM CHLORIDE 0.9 % IV BOLUS
500.0000 mL | Freq: Once | INTRAVENOUS | Status: AC
  Administered 2019-12-05: 500 mL via INTRAVENOUS

## 2019-12-05 MED ORDER — LISINOPRIL 20 MG PO TABS
20.0000 mg | ORAL_TABLET | Freq: Every day | ORAL | Status: DC
  Administered 2019-12-06 – 2019-12-09 (×2): 20 mg via ORAL
  Filled 2019-12-05 (×3): qty 1

## 2019-12-05 MED ORDER — ENALAPRILAT 1.25 MG/ML IV SOLN
0.6250 mg | Freq: Four times a day (QID) | INTRAVENOUS | Status: DC | PRN
  Administered 2019-12-06 (×2): 0.625 mg via INTRAVENOUS
  Filled 2019-12-05 (×4): qty 0.5

## 2019-12-05 MED ORDER — OXYBUTYNIN CHLORIDE 5 MG PO TABS: 5.0000 mg | ORAL_TABLET | Freq: Two times a day (BID) | ORAL | Status: DC

## 2019-12-05 MED ORDER — SIMVASTATIN 20 MG PO TABS
20.0000 mg | ORAL_TABLET | Freq: Every evening | ORAL | Status: DC
  Filled 2019-12-05: qty 1

## 2019-12-05 MED ORDER — ACETAMINOPHEN 160 MG/5ML PO SOLN: 650.0000 mg | ORAL | Status: DC | PRN

## 2019-12-05 MED ORDER — ACETAMINOPHEN 650 MG RE SUPP: 650.0000 mg | RECTAL | Status: DC | PRN

## 2019-12-05 MED ORDER — ACETAMINOPHEN 325 MG PO TABS: 650.0000 mg | ORAL_TABLET | ORAL | Status: DC | PRN

## 2019-12-05 MED ORDER — HEPARIN SODIUM (PORCINE) 5000 UNIT/ML IJ SOLN
5000.0000 [IU] | Freq: Two times a day (BID) | INTRAMUSCULAR | Status: DC
  Administered 2019-12-05 – 2019-12-15 (×21): 5000 [IU] via SUBCUTANEOUS
  Filled 2019-12-05 (×21): qty 1

## 2019-12-05 MED ORDER — STROKE: EARLY STAGES OF RECOVERY BOOK
Freq: Once | Status: AC
  Filled 2019-12-05: qty 1

## 2019-12-05 MED ORDER — CLOPIDOGREL BISULFATE 75 MG PO TABS
75.0000 mg | ORAL_TABLET | Freq: Every day | ORAL | Status: DC
  Administered 2019-12-06 – 2019-12-09 (×2): 75 mg via ORAL
  Filled 2019-12-05 (×3): qty 1

## 2019-12-05 MED ORDER — LEVOTHYROXINE SODIUM 25 MCG PO TABS
125.0000 ug | ORAL_TABLET | Freq: Every day | ORAL | Status: DC
  Filled 2019-12-05: qty 1

## 2019-12-05 NOTE — ED Notes (Signed)
ED TO INPATIENT HANDOFF REPORT  ED Nurse Name and Phone #: Slade Pierpoint  S Name/Age/Gender Jillian Gay 85 y.o. female Room/Bed: 058C/058C  Code Status   Code Status: DNR  Home/SNF/Other Home Non-verbal Is this baseline? No   Triage Complete: Triage complete  Chief Complaint AMS (altered mental status) [R41.82]  Triage Note Pt here from home, family reports she is not acting herself, has been weaker and less talkative since Tuesday. Pt has hx of stroke, residual slurred speech. Responsive to painful stimuli on EMS arrival, improving to alert on arrival to ED but only word she has said in route was "ouch." Pt bedbound at baseline, unable to even stand to pivot. Pt recently finished abx for infection in lower legs, both wrapped on arrival. Follows some commands, said "hey" but not answering questions appropriately.     Allergies Allergies  Allergen Reactions  . Celebrex [Celecoxib]   . Macrodantin   . Metoprolol   . Norvasc [Amlodipine Besylate]     Level of Care/Admitting Diagnosis ED Disposition    ED Disposition Condition Comment   Admit  Hospital Area: [facility] [[id number]]  Level of Care: Telemetry Medical [104]  Covid Evaluation: Asymptomatic Screening Protocol (No Symptoms)  Diagnosis: AMS (altered mental status) [0086761]  Admitting Physician: Emeline General [[id number]]  Attending Physician: Emeline General [[id number]]  Estimated length of stay: 3 - 4 days  Certification:: I certify this patient will need inpatient services for at least 2 midnights       B Medical/Surgery History Past Medical History:  Diagnosis Date  . Allergy   . Hypertension   . Stroke (HCC)   . Thyroid disease    No past surgical history on file.   A IV Location/Drains/Wounds Patient Lines/Drains/Airways Status   Active Line/Drains/Airways    Name:   Placement date:   Placement time:   Site:   Days:   Peripheral IV 12/05/19 Distal;Left Forearm   12/05/19    0937     Forearm   less than 1          Intake/Output Last 24 hours  Intake/Output Summary (Last 24 hours) at 12/05/2019 1658 Last data filed at 12/05/2019 1044 Gross per 24 hour  Intake 500 ml  Output -  Net 500 ml    Labs/Imaging Results for orders placed or performed during the hospital encounter of 12/05/19 (from the past 48 hour(s))  CBG monitoring, ED     Status: Abnormal   Collection Time: 12/05/19  9:53 AM  Result Value Ref Range   Glucose-Capillary 122 (H) 70 - 99 mg/dL   Comment 1 Notify RN    Comment 2 Document in Chart   Comprehensive metabolic panel     Status: Abnormal   Collection Time: 12/05/19 10:07 AM  Result Value Ref Range   Sodium 146 (H) 135 - 145 mmol/L   Potassium 3.6 3.5 - 5.1 mmol/L   Chloride 113 (H) 98 - 111 mmol/L   CO2 24 22 - 32 mmol/L   Glucose, Bld 128 (H) 70 - 99 mg/dL   BUN 16 8 - 23 mg/dL   Creatinine, Ser 9.98 (H) 0.44 - 1.00 mg/dL   Calcium 8.3 (L) 8.9 - 10.3 mg/dL   Total Protein 5.1 (L) 6.5 - 8.1 g/dL   Albumin 2.8 (L) 3.5 - 5.0 g/dL   AST 26 15 - 41 U/L   ALT 23 0 - 44 U/L   Alkaline Phosphatase 44 38 - 126 U/L  Total Bilirubin 0.8 0.3 - 1.2 mg/dL   GFR calc non Af Amer 50 (L) >60 mL/min   GFR calc Af Amer 57 (L) >60 mL/min   Anion gap 9 5 - 15    Comment: Performed at [facility] [address], [place], [place] [address]  CBC with Differential     Status: Abnormal   Collection Time: 12/05/19 10:07 AM  Result Value Ref Range   WBC 6.3 4.0 - 10.5 K/uL   RBC 3.72 (L) 3.87 - 5.11 MIL/uL   Hemoglobin 12.2 12.0 - 15.0 g/dL   HCT 38.4 36.0 - 46.0 %   MCV 103.2 (H) 80.0 - 100.0 fL   MCH 32.8 26.0 - 34.0 pg   MCHC 31.8 30.0 - 36.0 g/dL   RDW 14.4 11.5 - 15.5 %   Platelets 214 150 - 400 K/uL    Comment: REPEATED TO VERIFY   nRBC 0.0 0.0 - 0.2 %   Neutrophils Relative % 74 %   Neutro Abs 4.6 1.7 - 7.7 K/uL   Lymphocytes Relative 16 %   Lymphs Abs 1.0 0.7 - 4.0 K/uL   Monocytes Relative 9 %   Monocytes Absolute 0.6 0.1 -  1.0 K/uL   Eosinophils Relative 1 %   Eosinophils Absolute 0.1 0.0 - 0.5 K/uL   Basophils Relative 0 %   Basophils Absolute 0.0 0.0 - 0.1 K/uL   Immature Granulocytes 0 %   Abs Immature Granulocytes 0.02 0.00 - 0.07 K/uL    Comment: Performed at [facility] [address] [place], [place] [address]  SARS CORONAVIRUS 2 (TAT 6-24 HRS) Nasopharyngeal Nasopharyngeal Swab     Status: None   Collection Time: 12/05/19 10:07 AM   Specimen: Nasopharyngeal Swab  Result Value Ref Range   SARS Coronavirus 2 NEGATIVE NEGATIVE    Comment: (NOTE) SARS-CoV-2 target nucleic acids are NOT DETECTED. The SARS-CoV-2 RNA is generally detectable in upper and lower respiratory specimens during the acute phase of infection. Negative results do not preclude SARS-CoV-2 infection, do not rule out co-infections with other pathogens, and should not be used as the sole basis for treatment or other patient management decisions. Negative results must be combined with clinical observations, patient history, and epidemiological information. The expected result is Negative. Fact Sheet for Patients: SugarRoll.be Fact Sheet for Healthcare Providers: https://www.woods-mathews.com/ This test is not yet approved or cleared by the [place] FDA and  has been authorized for detection and/or diagnosis of SARS-CoV-2 by FDA under an Emergency Use Authorization (EUA). This EUA will remain  in effect (meaning this test can be used) for the duration of the COVID-19 declaration under Section 56 4(b)(1) of the Act, 21 U.S.C. section 360bbb-3(b)(1), unless the authorization is terminated or revoked sooner. Performed at [facility], [place] [address], [place], [place] [address]   POCT I-Stat EG7     Status: Abnormal   Collection Time: 12/05/19 10:17 AM  Result Value Ref Range   pH, Ven 7.404 7.250 - 7.430   pCO2, Ven 46.7 44.0 - 60.0 mmHg   pO2, Ven 93.0 (H) 32.0 - 45.0  mmHg   Bicarbonate 29.2 (H) 20.0 - 28.0 mmol/L   TCO2 31 22 - 32 mmol/L   O2 Saturation 97.0 %   Acid-Base Excess 4.0 (H) 0.0 - 2.0 mmol/L   Sodium 144 135 - 145 mmol/L   Potassium 3.8 3.5 - 5.1 mmol/L   Calcium, Ion 1.17 1.15 - 1.40 mmol/L   HCT  35.0 (L) 36.0 - 46.0 %   Hemoglobin 11.9 (L) 12.0 - 15.0 g/dL   Patient temperature HIDE    Sample type VENOUS   TSH     Status: Abnormal   Collection Time: 12/05/19  2:58 PM  Result Value Ref Range   TSH 6.162 (H) 0.350 - 4.500 uIU/mL    Comment: Performed by a 3rd Generation assay with a functional sensitivity of <=0.01 uIU/mL. Performed at [facility], [address], [place], [place] [address]   Vitamin B12     Status: None   Collection Time: 12/05/19  2:58 PM  Result Value Ref Range   Vitamin B-12 528 180 - 914 pg/mL    Comment: (NOTE) This assay is not validated for testing neonatal or myeloproliferative syndrome specimens for Vitamin B12 levels. Performed at [facility], [address] [place], [place] [address]   Urinalysis, Routine w reflex microscopic     Status: Abnormal   Collection Time: 12/05/19  4:00 PM  Result Value Ref Range   Color, Urine YELLOW YELLOW   APPearance CLEAR CLEAR   Specific Gravity, Urine 1.013 1.005 - 1.030   pH 6.0 5.0 - 8.0   Glucose, UA NEGATIVE NEGATIVE mg/dL   Hgb urine dipstick NEGATIVE NEGATIVE   Bilirubin Urine NEGATIVE NEGATIVE   Ketones, ur NEGATIVE NEGATIVE mg/dL   Protein, ur NEGATIVE NEGATIVE mg/dL   Nitrite NEGATIVE NEGATIVE   Leukocytes,Ua TRACE (A) NEGATIVE   RBC / HPF 0-5 0 - 5 RBC/hpf   WBC, UA 11-20 0 - 5 WBC/hpf   Bacteria, UA RARE (A) NONE SEEN   Squamous Epithelial / LPF 0-5 0 - 5    Comment: Performed at [facility], [address], [place], [place] [address]   CT Head Wo Contrast  Result Date: 12/05/2019 CLINICAL DATA:  Weakness, altered mental status EXAM: CT HEAD WITHOUT CONTRAST TECHNIQUE: Contiguous axial images were obtained from the base  of the skull through the vertex without intravenous contrast. COMPARISON:  02/14/2010 FINDINGS: Brain: Encephalomalacia within the right frontal lobe, superior aspect of the right cerebellum, and medial left cerebellum compatible with remote prior infarcts. Additional smaller remote lacunar infarcts in the bilateral basal ganglia. Extensive low-density changes within the periventricular and subcortical white matter compatible with chronic microvascular ischemic change. Moderate-advanced diffuse cerebral volume loss. No evidence of intracranial hemorrhage. No mass or mass effect. Vascular: Mild atherosclerotic calcifications involving the large vessels of the skull base. No unexpected hyperdense vessel. Skull: Normal. Negative for fracture or focal lesion. Sinuses/Orbits: Mucosal thickening within the inferior left maxillary sinus. Remaining paranasal sinuses are clear. Orbital structures unremarkable. Other: None. IMPRESSION: 1. No acute intracranial abnormality evident by CT. Further evaluation can be performed with MRI, as clinically indicated. 2. Advanced chronic microvascular ischemic changes and cerebral volume loss. 3. Remote prior infarcts in the right frontal lobe, superior right cerebellum, and medial left cerebellum. 4. Left maxillary sinus disease. Electronically Signed   By: Duanne Guess M.D.   On: 12/05/2019 12:02   DG Chest Port 1 View  Result Date: 12/05/2019 CLINICAL DATA:  Altered mental status, weakness EXAM: PORTABLE CHEST 1 VIEW COMPARISON:  02/14/2010 FINDINGS: The heart size and mediastinal contours are within normal limits. Lung volumes are low. Streaky bibasilar opacities. No pleural effusion or pneumothorax. Degenerative changes of the shoulders, right greater than left. IMPRESSION: Low lung volumes with streaky bibasilar opacities, likely atelectasis. Electronically Signed   By: Duanne Guess M.D.   On:  12/05/2019 11:08    Pending Labs Unresulted Labs (From admission,  onward)    Start     Ordered   12/06/19 0500  Lipid panel  Tomorrow morning,   R    Comments: Fasting    12/05/19 1338   12/05/19 0958  Blood gas, venous  Once,   STAT     12/05/19 0959          Vitals/Pain Today's Vitals   12/05/19 1500 12/05/19 1515 12/05/19 1530 12/05/19 1649  BP: (!) 167/81 (!) 173/75 (!) 177/75   Pulse: 66 70 74 97  Resp: 15 15 12 20   Temp:      TempSrc:      SpO2: 97% 95% 98% 93%  PainSc:        Isolation Precautions No active isolations  Medications Medications  lisinopril (ZESTRIL) tablet 20 mg (20 mg Oral Not Given 12/05/19 1438)  simvastatin (ZOCOR) tablet 20 mg (has no administration in time range)  levothyroxine (SYNTHROID) tablet 125 mcg (125 mcg Oral Not Given 12/05/19 1428)  clopidogrel (PLAVIX) tablet 75 mg (75 mg Oral Not Given 12/05/19 1439)   stroke: mapping our early stages of recovery book (has no administration in time range)  0.9 %  sodium chloride infusion ( Intravenous New Bag/Given 12/05/19 1540)  acetaminophen (TYLENOL) tablet 650 mg (has no administration in time range)    Or  acetaminophen (TYLENOL) 160 MG/5ML solution 650 mg (has no administration in time range)    Or  acetaminophen (TYLENOL) suppository 650 mg (has no administration in time range)  heparin injection 5,000 Units (5,000 Units Subcutaneous Given 12/05/19 1540)  enalaprilat (VASOTEC) injection 0.625 mg (has no administration in time range)  sodium chloride 0.9 % bolus 500 mL (0 mLs Intravenous Stopped 12/05/19 1044)    Mobility non-ambulatory High fall risk   Focused Assessments Neuro Assessment Handoff:  Swallow screen pass? No          Neuro Assessment: Exceptions to WDL Neuro Checks:      Last Documented NIHSS Modified Score:   Has TPA been given? No If patient is a Neuro Trauma and patient is going to OR before floor call report to 4N Charge nurse: [phone] or [phone]     R Recommendations: See Admitting Provider  Note  Report given to:   Additional Notes:

## 2019-12-05 NOTE — Progress Notes (Signed)
Nurse said pt cannot lay flat do to fracture and pain. She is aware pt would have to lay flat for this exam. Suggested we try talking to admitting doctor tomorrow morning (12/19).

## 2019-12-05 NOTE — Progress Notes (Signed)
Patient more awake, mumbling, no significant facial droop, moving her limbs, but not following command. Waiting for MRI.

## 2019-12-05 NOTE — H&P (Addendum)
History and Physical    Jillian Gay ALP:[id number] DOB: October 31, 1932 DOA: 12/05/2019  PCP: Burnard Bunting, MD   Patient coming from: Home  I have personally briefly reviewed patient's old medical records in [place]  Chief Complaint: Change of mental status  HPI: Jillian Gay is a 85 y.o. female with medical history significant of stroke with residual right-sided weakness, slurred speech, hypertension hyperlipidemia, thyroidism, presented with running of her weakness for 7 days and change in mental status for 2 days.  Patient was confused, lethargic unable to answer questions regarding her history, most of the history collected via phone conversation with patient husband. Baseline mental status awake alert oriented x3, about 2 days ago patient started to have episodic confusion, unable to recognize family members since yesterday.  Patient had stroke about 6 years ago and has had residual weakness on the right side since then, Baseline is need help to ambulate, only can do standing up and able to transfer herself from bed to wheelchair.  However since about 7 days ago she is gradually lost her ability to do the transferring from bed to wheelchair, given stand up from toilet bowl.  Patient husband denied any other symptoms such as pain cough fever, diarrhea, no known Covid contact. ED Course: CT head negative, CBS, CMP WNL, MRI ordered to rule out stroke.  ED physician was able to interview the patient on arrival, patient noticed to become more lethargic and sleepy.  Review of Systems: Unable to perform patient has altered mental status.   Past Medical History:  Diagnosis Date  . Allergy   . Hypertension   . Stroke ([place])   . Thyroid disease     Unable to perform patient has altered mental status   reports that she quit smoking about 27 years ago. Her smoking use included cigarettes. She does not have any smokeless tobacco history on file. No history on file for alcohol and  drug.  Allergies  Allergen Reactions  . Celebrex [Celecoxib]   . Macrodantin   . Metoprolol   . Norvasc [Amlodipine Besylate]     Unable to perform patient has altered mental status  Prior to Admission medications   Medication Sig Start Date End Date Taking? Authorizing Provider  calcium carbonate (OS-CAL) 600 MG TABS Take 600 mg by mouth 2 (two) times daily with a meal.    [provider]  cholecalciferol (VITAMIN D) 400 UNITS TABS Take 400 Units by mouth.    [provider]  clopidogrel (PLAVIX) 75 MG tablet Take 75 mg by mouth daily.    [provider]  levothyroxine (SYNTHROID, LEVOTHROID) 125 MCG tablet Take 125 mcg by mouth daily.    [provider]  lisinopril (PRINIVIL,ZESTRIL) 20 MG tablet Take 20 mg by mouth daily.    [provider]  Multiple Vitamins-Minerals (MULTIVITAMIN WITH MINERALS) tablet Take 1 tablet by mouth daily.    [provider]  oxybutynin (DITROPAN) 5 MG tablet Take 5 mg by mouth 2 (two) times daily.    Kirsteins, Luanna Salk, MD  simvastatin (ZOCOR) 20 MG tablet Take 20 mg by mouth every evening.    [provider]  vitamin C (ASCORBIC ACID) 500 MG tablet Take 500 mg by mouth 2 (two) times daily.    [provider]    Physical Exam: Vitals:   12/05/19 1245 12/05/19 1300 12/05/19 1315 12/05/19 1330  BP: (!) 160/71 (!) 156/71 (!) 152/78 (!) 166/77  Pulse: 68 65 68 68  Resp: 15 12 (!) 24 (!) 27  Temp:      TempSrc:      SpO2: 96% 96% (!) 87% 97%    Constitutional: NAD, calm, comfortable Vitals:   12/05/19 1245 12/05/19 1300 12/05/19 1315 12/05/19 1330  BP: (!) 160/71 (!) 156/71 (!) 152/78 (!) 166/77  Pulse: 68 65 68 68  Resp: 15 12 (!) 24 (!) 27  Temp:      TempSrc:      SpO2: 96% 96% (!) 87% 97%   Eyes: PERRL, lids and conjunctivae normal ENMT: Mucous membranes are moist. Posterior pharynx clear of any exudate or lesions.Normal dentition.  Neck: normal, supple, no masses, no  thyromegaly Respiratory: clear to auscultation bilaterally, no wheezing, no crackles. Normal respiratory effort. No accessory muscle use.  Cardiovascular: Regular rate and rhythm, soft systolic murmur aortic area, no rubs / gallops. No extremity edema. 2+ pedal pulses. No carotid bruits.  Abdomen: no tenderness, no masses palpated. No hepatosplenomegaly. Bowel sounds positive.  Musculoskeletal: no clubbing / cyanosis. No joint deformity upper and lower extremities. Good ROM, no contractures. Normal muscle tone.  Skin: both legs were wrapped below knee. Neurologic: Arousable lethargic, no facial drooping, unable to perform rest of the neuro exam due to altered mental status, muscle tone appears to be normal, withdrawal from pain. Psychiatric: Open eyes upon calling her name   Labs on Admission: I have personally reviewed following labs and imaging studies  CBC: Recent Labs  Lab 12/05/19 1007 12/05/19 1017  WBC 6.3  --   NEUTROABS 4.6  --   HGB 12.2 11.9*  HCT 38.4 35.0*  MCV 103.2*  --   PLT 214  --    Basic Metabolic Panel: Recent Labs  Lab 12/05/19 1007 12/05/19 1017  NA 146* 144  K 3.6 3.8  CL 113*  --   CO2 24  --   GLUCOSE 128*  --   BUN 16  --   CREATININE 1.03*  --   CALCIUM 8.3*  --    GFR: CrCl cannot be calculated (Unknown ideal weight.). Liver Function Tests: Recent Labs  Lab 12/05/19 1007  AST 26  ALT 23  ALKPHOS 44  BILITOT 0.8  PROT 5.1*  ALBUMIN 2.8*   No results for input(s): LIPASE, AMYLASE in the last 168 hours. No results for input(s): AMMONIA in the last 168 hours. Coagulation Profile: No results for input(s): INR, PROTIME in the last 168 hours. Cardiac Enzymes: No results for input(s): CKTOTAL, CKMB, CKMBINDEX, TROPONINI in the last 168 hours. BNP (last 3 results) No results for input(s): PROBNP in the last 8760 hours. HbA1C: No results for input(s): HGBA1C in the last 72 hours. CBG: Recent Labs  Lab 12/05/19 0953  GLUCAP 122*    Lipid Profile: No results for input(s): CHOL, HDL, LDLCALC, TRIG, CHOLHDL, LDLDIRECT in the last 72 hours. Thyroid Function Tests: No results for input(s): TSH, T4TOTAL, FREET4, T3FREE, THYROIDAB in the last 72 hours. Anemia Panel: No results for input(s): VITAMINB12, FOLATE, FERRITIN, TIBC, IRON, RETICCTPCT in the last 72 hours. Urine analysis:    Component Value Date/Time   COLORURINE YELLOW 03/02/2010 1143   APPEARANCEUR CLEAR 03/02/2010 1143   LABSPEC 1.012 03/02/2010 1143   PHURINE 7.0 03/02/2010 1143   GLUCOSEU NEGATIVE 03/02/2010 1143   HGBUR NEGATIVE 03/02/2010 1143   BILIRUBINUR NEGATIVE 03/02/2010 1143   KETONESUR NEGATIVE 03/02/2010 1143   PROTEINUR NEGATIVE 03/02/2010 1143   UROBILINOGEN 0.2 03/02/2010 1143   NITRITE NEGATIVE 03/02/2010 1143   LEUKOCYTESUR  SMALL (A) 03/02/2010 1143    Radiological Exams on Admission: CT Head Wo Contrast  Result Date: 12/05/2019 CLINICAL DATA:  Weakness, altered mental status EXAM: CT HEAD WITHOUT CONTRAST TECHNIQUE: Contiguous axial images were obtained from the base of the skull through the vertex without intravenous contrast. COMPARISON:  02/14/2010 FINDINGS: Brain: Encephalomalacia within the right frontal lobe, superior aspect of the right cerebellum, and medial left cerebellum compatible with remote prior infarcts. Additional smaller remote lacunar infarcts in the bilateral basal ganglia. Extensive low-density changes within the periventricular and subcortical white matter compatible with chronic microvascular ischemic change. Moderate-advanced diffuse cerebral volume loss. No evidence of intracranial hemorrhage. No mass or mass effect. Vascular: Mild atherosclerotic calcifications involving the large vessels of the skull base. No unexpected hyperdense vessel. Skull: Normal. Negative for fracture or focal lesion. Sinuses/Orbits: Mucosal thickening within the inferior left maxillary sinus. Remaining paranasal sinuses are clear. Orbital  structures unremarkable. Other: None. IMPRESSION: 1. No acute intracranial abnormality evident by CT. Further evaluation can be performed with MRI, as clinically indicated. 2. Advanced chronic microvascular ischemic changes and cerebral volume loss. 3. Remote prior infarcts in the right frontal lobe, superior right cerebellum, and medial left cerebellum. 4. Left maxillary sinus disease. Electronically Signed   By: Duanne Guess M.D.   On: 12/05/2019 12:02   DG Chest Port 1 View  Result Date: 12/05/2019 CLINICAL DATA:  Altered mental status, weakness EXAM: PORTABLE CHEST 1 VIEW COMPARISON:  02/14/2010 FINDINGS: The heart size and mediastinal contours are within normal limits. Lung volumes are low. Streaky bibasilar opacities. No pleural effusion or pneumothorax. Degenerative changes of the shoulders, right greater than left. IMPRESSION: Low lung volumes with streaky bibasilar opacities, likely atelectasis. Electronically Signed   By: Duanne Guess M.D.   On: 12/05/2019 11:08    EKG: Independently reviewed.  Reported as A. Fib, likely due to fine tremors.  Assessment/Plan Active Problems:   Altered mental state   AMS (altered mental status)  Altered mental status, rule out any organic reasons, UA was sent to rule out UTI, chest x-ray showed no signs of pneumonia, no elevated WBC at this point.  Her neuro exam are quite limited but does not appear to be focal, no meningeal sign. MRI was also ordered to rule out any major strokes.  Discussion with patient husband implying that patient does not have a baseline dementia, family reported no seizure activity home, consult was placed to neurology and waiting for callback.  Neurochecks, fall precautions.  Hypertension, continue home meds, in case patient cannot swallow, add as needed BP meds.  Hypothyroidism, check TSH, continue home dose of Synthroid.  History of stroke, on Plavix and statin, MRI ordered.  DVT prophylaxis: Heparin subcu Code  Status: DNR Family Communication: Discussed with husband Normand Sloop over phone. disposition Plan: SNF? Consults called: Neurology Admission status: Tele admission   Emeline General MD [facility] Pager [phone]  If 7PM-7AM, please contact night-coverage [email or web address] Password [email or web address]  12/05/2019, 1:52 PM

## 2019-12-05 NOTE — ED Notes (Signed)
[phone] TY grandson wants an update please

## 2019-12-05 NOTE — ED Provider Notes (Signed)
[facility] EMERGENCY DEPARTMENT Provider Note   CSN: [id number] Arrival date & time: 12/05/19  4098     History Chief Complaint  Patient presents with  . Altered Mental Status    Jillian Gay is a 85 y.o. female.  HPI   She presents by EMS for evaluation of altered mental status.  I was able to reach patient's granddaughter on the phone at the time I saw the patient.  Granddaughter states that this morning the patient was having trouble swallowing, and seemed to gag when she was helped to eat.  She apparently ate a normal dinner yesterday.  Over the last several days she has had increasing difficulty talking, and has been noted to be somewhat less responsive than usual.  Family members are concerned that she has "had another stroke."  She has been taking her usual medications up until this morning and did not take any today.  According to the granddaughter who was communicating with the patient's husband at the time; the patient would not want to be intubated or have cardiac resuscitation because "when she is ready she wants to go be with the Reita Cliche."  The patient is unable to contribute to history.  Level 5 caveat-altered mental status  Past Medical History:  Diagnosis Date  . Allergy   . Hypertension   . Stroke ([place])   . Thyroid disease     Patient Active Problem List   Diagnosis Date Noted  . 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 Neurogenic bladder 02/06/2012    No past surgical history on file.   OB History   No obstetric history on file.     No family history on file.  Social History   Tobacco Use  . Smoking status: Former Smoker    Types: Cigarettes    Quit date: 02/06/1992    Years since quitting: 27.8  Substance Use Topics  . Alcohol use: Not on file  . Drug use: Not on file    Home Medications Prior to Admission medications   Medication Sig Start Date End Date Taking? Authorizing Provider  calcium carbonate  (OS-CAL) 600 MG TABS Take 600 mg by mouth 2 (two) times daily with a meal.    [provider]  cholecalciferol (VITAMIN D) 400 UNITS TABS Take 400 Units by mouth.    [provider]  clopidogrel (PLAVIX) 75 MG tablet Take 75 mg by mouth daily.    [provider]  levothyroxine (SYNTHROID, LEVOTHROID) 125 MCG tablet Take 125 mcg by mouth daily.    [provider]  lisinopril (PRINIVIL,ZESTRIL) 20 MG tablet Take 20 mg by mouth daily.    [provider]  Multiple Vitamins-Minerals (MULTIVITAMIN WITH MINERALS) tablet Take 1 tablet by mouth daily.    [provider]  oxybutynin (DITROPAN) 5 MG tablet Take 5 mg by mouth 2 (two) times daily.    Kirsteins, Luanna Salk, MD  simvastatin (ZOCOR) 20 MG tablet Take 20 mg by mouth every evening.    [provider]  vitamin C (ASCORBIC ACID) 500 MG tablet Take 500 mg by mouth 2 (two) times daily.    [provider]    Allergies    Celebrex [celecoxib], Macrodantin, Metoprolol, and Norvasc [amlodipine besylate]  Review of Systems   Review of Systems  Unable to perform ROS: Mental status change    Physical Exam Updated Vital Signs BP (!) 152/74   Pulse 64   Temp 98.7 F (37.1 C) (  Rectal)   Resp (!) 23   SpO2 97%   Physical Exam Vitals and nursing note reviewed.  Constitutional:      General: She is in acute distress.     Appearance: She is well-developed. She is obese. She is ill-appearing. She is not toxic-appearing or diaphoretic.  HENT:     Head: Normocephalic and atraumatic.     Right Ear: External ear normal.     Left Ear: External ear normal.  Eyes:     Conjunctiva/sclera: Conjunctivae normal.     Pupils: Pupils are equal, round, and reactive to light.  Neck:     Trachea: Phonation normal.  Cardiovascular:     Rate and Rhythm: Normal rate and regular rhythm.     Heart sounds: Normal heart sounds.  Pulmonary:     Effort: Pulmonary effort is normal.     Breath  sounds: Normal breath sounds.  Abdominal:     Palpations: Abdomen is soft.     Tenderness: There is no abdominal tenderness.  Musculoskeletal:        General: Normal range of motion.     Cervical back: Normal range of motion and neck supple.  Skin:    General: Skin is warm and dry.  Neurological:     Mental Status: She is alert.     GCS: GCS eye subscore is 4. GCS verbal subscore is 2. GCS motor subscore is 6.     Motor: No abnormal muscle tone.     Comments: Dysarthria.  Right facial droop.  Clonus right arm with passive motion.  No appreciable clonus of ankles.  Able to open mouth and close eyes on command, with symmetric motion.  Does not move extremities on command.  Psychiatric:     Comments: Responsive with unintelligible words     ED Results / Procedures / Treatments   Labs (all labs ordered are listed, but only abnormal results are displayed) Labs Reviewed  COMPREHENSIVE METABOLIC PANEL - Abnormal; Notable for the following components:      Result Value   Sodium 146 (*)    Chloride 113 (*)    Glucose, Bld 128 (*)    Creatinine, Ser 1.03 (*)    Calcium 8.3 (*)    Total Protein 5.1 (*)    Albumin 2.8 (*)    GFR calc non Af Amer 50 (*)    GFR calc Af Amer 57 (*)    All other components within normal limits  CBC WITH DIFFERENTIAL/PLATELET - Abnormal; Notable for the following components:   RBC 3.72 (*)    MCV 103.2 (*)    All other components within normal limits  CBG MONITORING, ED - Abnormal; Notable for the following components:   Glucose-Capillary 122 (*)    All other components within normal limits  POCT I-STAT EG7 - Abnormal; Notable for the following components:   pO2, Ven 93.0 (*)    Bicarbonate 29.2 (*)    Acid-Base Excess 4.0 (*)    HCT 35.0 (*)    Hemoglobin 11.9 (*)    All other components within normal limits  SARS CORONAVIRUS 2 (TAT 6-24 HRS)  URINALYSIS, ROUTINE W REFLEX MICROSCOPIC  BLOOD GAS, VENOUS    EKG EKG  Interpretation  Date/Time:  Friday December 05 2019 09:46:20 EST Ventricular Rate:  68 PR Interval:    QRS Duration: 93 QT Interval:  405 QTC Calculation: 431 R Axis:   -11 Text Interpretation: Atrial fibrillation Borderline repolarization abnormality Artifact Since last tracing Atrial fibrillation  present Otherwise no significant change Confirmed by Mancel BaleWentz, Kursten Kruk [phone]) on 12/05/2019 9:49:49 AM   Radiology CT Head Wo Contrast  Result Date: 12/05/2019 CLINICAL DATA:  Weakness, altered mental status EXAM: CT HEAD WITHOUT CONTRAST TECHNIQUE: Contiguous axial images were obtained from the base of the skull through the vertex without intravenous contrast. COMPARISON:  02/14/2010 FINDINGS: Brain: Encephalomalacia within the right frontal lobe, superior aspect of the right cerebellum, and medial left cerebellum compatible with remote prior infarcts. Additional smaller remote lacunar infarcts in the bilateral basal ganglia. Extensive low-density changes within the periventricular and subcortical white matter compatible with chronic microvascular ischemic change. Moderate-advanced diffuse cerebral volume loss. No evidence of intracranial hemorrhage. No mass or mass effect. Vascular: Mild atherosclerotic calcifications involving the large vessels of the skull base. No unexpected hyperdense vessel. Skull: Normal. Negative for fracture or focal lesion. Sinuses/Orbits: Mucosal thickening within the inferior left maxillary sinus. Remaining paranasal sinuses are clear. Orbital structures unremarkable. Other: None. IMPRESSION: 1. No acute intracranial abnormality evident by CT. Further evaluation can be performed with MRI, as clinically indicated. 2. Advanced chronic microvascular ischemic changes and cerebral volume loss. 3. Remote prior infarcts in the right frontal lobe, superior right cerebellum, and medial left cerebellum. 4. Left maxillary sinus disease. Electronically Signed   By: Duanne GuessNicholas  Plundo M.D.    On: 12/05/2019 12:02   DG Chest Port 1 View  Result Date: 12/05/2019 CLINICAL DATA:  Altered mental status, weakness EXAM: PORTABLE CHEST 1 VIEW COMPARISON:  02/14/2010 FINDINGS: The heart size and mediastinal contours are within normal limits. Lung volumes are low. Streaky bibasilar opacities. No pleural effusion or pneumothorax. Degenerative changes of the shoulders, right greater than left. IMPRESSION: Low lung volumes with streaky bibasilar opacities, likely atelectasis. Electronically Signed   By: Duanne GuessNicholas  Plundo M.D.   On: 12/05/2019 11:08    Procedures .Critical Care Performed by: Mancel BaleWentz, Nashid Pellum, MD Authorized by: Mancel BaleWentz, Eliberto Sole, MD   Critical care provider statement:    Critical care time (minutes):  35   Critical care start time:  12/05/2019 9:40 AM   Critical care end time:  12/05/2019 12:52 PM   Critical care time was exclusive of:  Separately billable procedures and treating other patients   Critical care was necessary to treat or prevent imminent or life-threatening deterioration of the following conditions:  CNS failure or compromise   Critical care was time spent personally by me on the following activities:  Blood draw for specimens, development of treatment plan with patient or surrogate, discussions with consultants, evaluation of patient's response to treatment, examination of patient, obtaining history from patient or surrogate, ordering and performing treatments and interventions, ordering and review of laboratory studies, pulse oximetry, re-evaluation of patient's condition, review of old charts and ordering and review of radiographic studies   (including critical care time)  Medications Ordered in ED Medications  sodium chloride 0.9 % bolus 500 mL (0 mLs Intravenous Stopped 12/05/19 1044)    ED Course  I have reviewed the triage vital signs and the nursing notes.  Pertinent labs & imaging results that were available during my care of the patient were reviewed by  me and considered in my medical decision making (see chart for details).  Clinical Course as of Dec 05 1251  Fri Dec 05, 2019  1041 Normal except PO2 high, bicarb high, acid-base elevated, hematocrit low on venous gas.  POCT I-Stat EG7(!) [EW]  1042 Atelectasis left lower lobe, interpreted by me  [facility]  1235 POCT I-Stat EG7(!) [EW]  1235 Normal except sodium high, chloride low, glucose high, creatinine high, calcium low, total protein low, albumin low, GFR low  Comprehensive metabolic panel(!) [EW]  1236 Normal except MCV high  CBC with Differential(!) [EW]  1248 I updated the patient's spouse, Myeisha Kruser, by phone.  He again reiterated that she is DNR status.  He was thankful for the efforts, and appreciative of the telephone call.   [EW]    Clinical Course User Index [EW] Mancel Bale, MD   MDM Rules/Calculators/A&P                       Patient Vitals for the past 24 hrs:  BP Temp Temp src Pulse Resp SpO2  12/05/19 1200 (!) 152/74 -- -- 64 (!) 23 97 %  12/05/19 1130 (!) 158/75 -- -- 65 14 96 %  12/05/19 1115 (!) 160/65 -- -- (!) 59 13 98 %  12/05/19 1100 (!) 150/68 -- -- (!) 48 15 95 %  12/05/19 1045 (!) 167/84 -- -- 64 17 98 %  12/05/19 1030 (!) 161/66 -- -- 66 16 99 %  12/05/19 1015 (!) 160/74 -- -- 63 14 97 %  12/05/19 1001 -- 98.7 F (37.1 C) Rectal -- -- --  12/05/19 0946 (!) 150/66 -- -- 66 20 96 %    12:41 PM Reevaluation with update and discussion. After initial assessment and treatment, an updated evaluation reveals patient sleeping comfortably, but is not arousable, with verbal or light touch stimulation. Mancel Bale   Medical Decision Making: Altered mental status, with some speech difficulty and swallowing difficulty.  Suspect stroke, which was not visualized on CT imaging so MRI was ordered.  In discussions with family members, she is DNR/DNI.  On screen evaluation, management lock abnormalities, are nonspecific but likely related  to poor nutrition with some dehydration.  Doubt serious bacterial infection or metabolic instability at this time.  She will require further treatment and stabilization to determine the appropriate course of treatment.  Will contact hospitalist for admission.  CRITICAL CARE-yes Performed by: Mancel Bale    Nursing Notes Reviewed/ Care Coordinated Applicable Imaging Reviewed Interpretation of Laboratory Data incorporated into ED treatment  12:51 PM-Consult complete with Hospitalist. Patient case explained and discussed. Consultant agrees to admit patient for further evaluation and treatment. Call ended at 1:05 PM  Plan: Admit  Final Clinical Impression(s) / ED Diagnoses Final diagnoses:  Altered mental status, unspecified altered mental status type    Rx / DC Orders ED Discharge Orders    None       Mancel Bale, MD 12/07/19 1336

## 2019-12-05 NOTE — Progress Notes (Signed)
Spoke with pt's granddaughter and husband. Stated pt is aphasic at baseline, right hand is contracting and has right arm weakness. Typically able to eat without issue, but choked while eating at home and was having a harder time handling secretions. Stated that about 3 days ago pt started forgetting family members as well. Husband wanted to make sure we are aware pt has compound fx in lumbar spine. Pt has a fall about a week ago and hit the back of her head on the dresser. She has bilat unaboots, husband states she has wounds on both legs and was due to be changed today, but unable to make appt. Carroll Kinds RN

## 2019-12-05 NOTE — ED Triage Notes (Addendum)
Pt here from home, family reports she is not acting herself, has been weaker and less talkative since Tuesday. Pt has hx of stroke, residual slurred speech. Responsive to painful stimuli on EMS arrival, improving to alert on arrival to ED but only word she has said in route was "ouch." Pt bedbound at baseline, unable to even stand to pivot. Pt recently finished abx for infection in lower legs, both wrapped on arrival. Follows some commands, said "hey" but not answering questions appropriately.

## 2019-12-06 ENCOUNTER — Inpatient Hospital Stay (HOSPITAL_COMMUNITY): Payer: Medicare Other

## 2019-12-06 DIAGNOSIS — G9341 Metabolic encephalopathy: Secondary | ICD-10-CM

## 2019-12-06 DIAGNOSIS — R404 Transient alteration of awareness: Secondary | ICD-10-CM

## 2019-12-06 DIAGNOSIS — R4182 Altered mental status, unspecified: Secondary | ICD-10-CM

## 2019-12-06 LAB — LIPID PANEL
Cholesterol: 131 mg/dL (ref 0–200)
HDL: 40 mg/dL — ABNORMAL LOW (ref >40)
LDL Cholesterol: 54 mg/dL (ref 0–99)
Total CHOL/HDL Ratio: 3.3 RATIO
Triglycerides: 186 mg/dL — ABNORMAL HIGH (ref <150)
VLDL: 37 mg/dL (ref 0–40)

## 2019-12-06 LAB — HEMOGLOBIN A1C
Hgb A1c MFr Bld: 6.3 % — ABNORMAL HIGH (ref 4.8–5.6)
Mean Plasma Glucose: 134.11 mg/dL

## 2019-12-06 LAB — T4, FREE: Free T4: 0.91 ng/dL (ref 0.61–1.12)

## 2019-12-06 LAB — AMMONIA: Ammonia: 13 umol/L (ref 9–35)

## 2019-12-06 MED ORDER — POLYETHYLENE GLYCOL 3350 17 G PO PACK: 17.0000 g | PACK | Freq: Every day | ORAL | Status: DC

## 2019-12-06 MED ORDER — MORPHINE SULFATE (PF) 2 MG/ML IV SOLN
2.0000 mg | Freq: Once | INTRAVENOUS | Status: DC
  Filled 2019-12-06: qty 1

## 2019-12-06 MED ORDER — SENNOSIDES-DOCUSATE SODIUM 8.6-50 MG PO TABS
2.0000 | ORAL_TABLET | Freq: Two times a day (BID) | ORAL | Status: DC
  Administered 2019-12-09: 2 via ORAL
  Filled 2019-12-06: qty 2

## 2019-12-06 MED ORDER — HYDRALAZINE HCL 20 MG/ML IJ SOLN: 5.0000 mg | Freq: Four times a day (QID) | INTRAMUSCULAR | Status: DC | PRN

## 2019-12-06 MED ORDER — BISACODYL 10 MG RE SUPP: 10.0000 mg | Freq: Every day | RECTAL | Status: DC | PRN

## 2019-12-06 MED ORDER — LORAZEPAM 2 MG/ML IJ SOLN: 1.0000 mg | INTRAMUSCULAR | Status: DC | PRN

## 2019-12-06 MED ORDER — SODIUM CHLORIDE 0.9 % IV SOLN
1.0000 g | INTRAVENOUS | Status: DC
  Administered 2019-12-06 – 2019-12-07 (×2): 1 g via INTRAVENOUS
  Filled 2019-12-06: qty 1
  Filled 2019-12-06: qty 10
  Filled 2019-12-06: qty 1

## 2019-12-06 NOTE — Progress Notes (Signed)
Spoke to [place], patient's grand daughter via phone [phone].  She states yesterday 12/05/19 between 8 am - 9 am the patient's had a blank stare, then suddenly her head started shaking uncontrollably twice.  The first time, it lasted about 30 seconds.  Had urinary incontinence.  45 minutes lapsed and during that time, her eyes were closed and she was not interactive.   Her head started shaking again uncontrollably for the second time.  Lasted about 20 seconds.   Leann also reports on 11/22/19 Patient hit her head.  Was laying in bed, trying to make herself comfortable and fell out of bed, striking her head on the bedside table.   MRI brain and EEG ordered and are pending.  Seizure precautions are in place.  Discussed with neurology who will see the patient in consultation.

## 2019-12-06 NOTE — Progress Notes (Signed)
EEG complete - results pending 

## 2019-12-06 NOTE — Evaluation (Signed)
Occupational Therapy Evaluation Patient Details Name: Jillian Gay MRN: [id number] DOB: June 25, 1932 Today's Date: 12/06/2019    History of Present Illness 85 yo female with onset of confusion and mental lethargy was admitted, has clear head CT but MRI pending.  Note acute metabolic encephalopathy, suspected UTI, now nonverbal and new non ambulatory status.  Covid (-).   PMHx:  stroke, thyroid disease, HTN, ambulatory with help,    Clinical Impression   Pt PTA: per chart, pt was mobilizing up to a week ago due to progressive weakness and family assist with ADL/IADL. Pt currently, Pt requiring increased assist for ADL; hand over hand assist trial with comb in L hand, but pt not initiating motion. Pt vision could be affected or mentation is decreased; delayed scan and looking at person who is talking. R sided gaze preference. Pt maxA to totalA +2 for ADL tasks. L side is weaker than R side. R side with residual CVA deficits with clasped hand.  Pt maxA +2 for sit to stand with stedy. Pt appeared to be assisting,but would not lift head up without assist. Pt would benefit from continued OT skilled services for ADL and mobility. OT following acutely.    Follow Up Recommendations  CIR    Equipment Recommendations  Other (comment)(to be determined by next venue)    Recommendations for Other Services Rehab consult     Precautions / Restrictions Precautions Precautions: Fall Precaution Comments: monitor HR, O2 sat and BP's Restrictions Weight Bearing Restrictions: No      Mobility Bed Mobility Overal bed mobility: Needs Assistance Bed Mobility: Rolling;Sit to Supine Rolling: Max assist   Supine to sit: Max assist;+2 for physical assistance;+2 for safety/equipment Sit to supine: Max assist;+2 for physical assistance;+2 for safety/equipment   General bed mobility comments: return to bed with 3 assist  Transfers Overall transfer level: Needs assistance Equipment used: 2 person hand  held assist Transfers: Sit to/from Stand Sit to Stand: Max assist;+2 physical assistance;+2 safety/equipment;From elevated surface        Lateral/Scoot Transfers: Total assist;+2 physical assistance;+2 safety/equipment;From elevated surface(3rd person to slide) General transfer comment: used stedy to support when PT and OT used bed pad to lift hips up to stand    Balance Overall balance assessment: Needs assistance Sitting-balance support: Feet supported;Bilateral upper extremity supported Sitting balance-Leahy Scale: Poor   Postural control: Posterior lean;Right lateral lean Standing balance support: Bilateral upper extremity supported;During functional activity Standing balance-Leahy Scale: Zero Standing balance comment: leans forward onto stedy                           ADL either performed or assessed with clinical judgement   ADL Overall ADL's : Needs assistance/impaired Eating/Feeding: Total assistance   Grooming: Total assistance Grooming Details (indicate cue type and reason): hand over hand assist with no intiation of movement Upper Body Bathing: Maximal assistance   Lower Body Bathing: Total assistance   Upper Body Dressing : Maximal assistance   Lower Body Dressing: Total assistance   Toilet Transfer: Maximal assistance;+2 for physical assistance;+2 for safety/equipment;Stand-pivot;Cueing for safety   Toileting- Clothing Manipulation and Hygiene: Total assistance;Sitting/lateral lean;Sit to/from stand       Functional mobility during ADLs: Maximal assistance;+2 for physical assistance;+2 for safety/equipment;Rolling walker;Cueing for safety General ADL Comments: Pt requiring increased assist for ADL; hand over hand assist trial with comb in L hand, but pt not initiating motion.     Vision Baseline Vision/History: No visual deficits  Vision Assessment?: Yes;Vision impaired- to be further tested in functional context Ocular Range of Motion:  Impaired-to be further tested in functional context(delayed scanning) Alignment/Gaze Preference: Gaze right Additional Comments: delayed scan and looking at person who is talking. R sided gaze preference.     Perception     Praxis      Pertinent Vitals/Pain Pain Assessment: Faces Faces Pain Scale: No hurt     Hand Dominance     Extremity/Trunk Assessment Upper Extremity Assessment Upper Extremity Assessment: Generalized weakness;RUE deficits/detail;LUE deficits/detail RUE Deficits / Details: previous CVA with R hand able to index and thumb, but difficulty with hand clasped into fist; shoulder limited; elbow wrist and hand 2+/5 MM grade LUE Deficits / Details: good squeeze 3+/5 MM grade; some AROm shoulder, mostly elbow through digits. L side weaker than R side.   Lower Extremity Assessment Lower Extremity Assessment: Generalized weakness   Cervical / Trunk Assessment Cervical / Trunk Assessment: Kyphotic   Communication Communication Communication: Expressive difficulties   Cognition Arousal/Alertness: Awake/alert;Lethargic Behavior During Therapy: Flat affect Overall Cognitive Status: No family/caregiver present to determine baseline cognitive functioning                                 General Comments: unclear if pt is different from baseline but chart indicates so   General Comments  Pt requires stedy for sit to stands, but lift for recliner to bed.    Exercises Exercises: Other exercises   Shoulder Instructions      Home Living Family/patient expects to be discharged to:: Private residence Living Arrangements: Spouse/significant other Available Help at Discharge: Family;Available 24 hours/day Type of Home: House                           Additional Comments: pt is unable to give any home living history      Prior Functioning/Environment Level of Independence: Needs assistance  Gait / Transfers Assistance Needed: per chart walked  with help post stroke ADL's / Homemaking Assistance Needed: husband is home to do cooking and cleaning, pt cannot state Communication / Swallowing Assistance Needed: non verbal now          OT Problem List: Decreased strength;Decreased activity tolerance;Decreased safety awareness;Pain;Impaired balance (sitting and/or standing);Decreased range of motion;Decreased cognition;Impaired UE functional use      OT Treatment/Interventions: Self-care/ADL training;Therapeutic exercise;Neuromuscular education;Energy conservation;DME and/or AE instruction;Therapeutic activities;Patient/family education;Balance training;Cognitive remediation/compensation;Visual/perceptual remediation/compensation    OT Goals(Current goals can be found in the care plan section) Acute Rehab OT Goals Patient Stated Goal: none stated, non verbal OT Goal Formulation: Patient unable to participate in goal setting Time For Goal Achievement: 12/20/19 Potential to Achieve Goals: Good ADL Goals Pt Will Perform Grooming: with mod assist;sitting Pt Will Perform Upper Body Dressing: with mod assist;sitting Pt Will Perform Lower Body Dressing: with max assist;sit to/from stand;sitting/lateral leans Pt Will Transfer to Toilet: with max assist;stand pivot transfer;bedside commode Pt/caregiver will Perform Home Exercise Program: Increased strength;Left upper extremity Additional ADL Goal #1: Pt will follow 75% of commands in 3/5 trials with 90% accuracy.  OT Frequency: Min 2X/week   Barriers to D/C:            Co-evaluation              AM-PAC OT "6 Clicks" Daily Activity     Outcome Measure Help from another person eating meals?: Total Help from another  person taking care of personal grooming?: Total Help from another person toileting, which includes using toliet, bedpan, or urinal?: Total Help from another person bathing (including washing, rinsing, drying)?: Total Help from another person to put on and taking off  regular upper body clothing?: Total Help from another person to put on and taking off regular lower body clothing?: Total 6 Click Score: 6   End of Session Equipment Utilized During Treatment: Gait belt Nurse Communication: Mobility status;Need for lift equipment  Activity Tolerance: Treatment limited secondary to medical complications (Comment);Patient limited by fatigue Patient left: in chair;with call bell/phone within reach;with chair alarm set  OT Visit Diagnosis: Unsteadiness on feet (R26.81);Muscle weakness (generalized) (M62.81)                Time: 1017-5102 OT Time Calculation (min): 39 min Charges:  OT General Charges $OT Visit: 1 Visit OT Evaluation $OT Eval Moderate Complexity: 1 Mod OT Treatments $Self Care/Home Management : 23-37 mins  Ebony Hail Harold Hedge) Marsa Aris OTR/L Acute Rehabilitation Services Pager: [phone] Office: [place] 12/06/2019, 4:27 PM

## 2019-12-06 NOTE — Progress Notes (Signed)
PROGRESS NOTE  Jillian Gay CZY:[id number] DOB: 20-May-1932 DOA: 12/05/2019 PCP: Geoffry Paradise, MD  HPI/Recap of past 73 hours: 85 year old female with past medical history significant for CVA with residual right-sided weakness, slurred speech, hypertension, hyperlipidemia, hypothyroidism, who presented with change in mental status, unable to recognize family members since yesterday.  Concern for new CVA, CT head done negative for any acute intracranial findings, showed old CVA.  MRI brain ordered and pending.  Admitted for AMS/CVA work-up.  12/06/19: Seen and examined.  Somnolent but easily arousable.  Nonverbal.  Appears confused.   Assessment/Plan: Active Problems:   Altered mental state   AMS (altered mental status)  Acute metabolic encephalopathy, unclear etiology CT head showed old CVA with no acute intracranial findings MRI brain ordered and pending Elevated TSH 6, obtain free T4 Continue home levothyroxine Independently reviewed chest x-ray done on admission which showed bibasilar atelectasis.  Presumed UTI WBC UA 11-20 Patient is not able to provide any history due to altered mental status Altered urine culture Treat empirically with Rocephin Will narrow down or discontinue antibiotics per urine culture results.  History of CVA with right sided weakness PT OT to assess once more alert and follows commands Fall precautions LDL 54 Continue Plavix and statin  Hypothyroidism TSH elevated Free T4 pending Continue levothyroxine  Essential hypertension Blood pressure is not at goal Continue home lisinopril  DVT prophylaxis: Heparin subcu 3 times daily  Code Status: DNR Family Communication:  None at bedside.  Will call family.   Disposition Plan:  Unknown at this time due to ongoing work-up.  Patient will require at least 2 midnights for further evaluation and treatment of present condition.    Objective: Vitals:   12/06/19 0605 12/06/19 0650 12/06/19  0751 12/06/19 0800  BP: (!) 167/67  (!) 174/68 (!) 168/70  Pulse: 64 64    Resp: 13 16    Temp:   98.4 F (36.9 C)   TempSrc:   Axillary   SpO2: 97% 96%      Intake/Output Summary (Last 24 hours) at 12/06/2019 1022 Last data filed at 12/06/2019 0500 Gross per 24 hour  Intake 1339.47 ml  Output 500 ml  Net 839.47 ml   There were no vitals filed for this visit.  Exam:  . General: 85 y.o. year-old female well developed well nourished in no acute distress.  Alert, nonverbal and minimally interactive. . Cardiovascular: Regular rate and rhythm with no rubs or gallops.  No thyromegaly or JVD noted.   Marland Kitchen Respiratory: Mild rales at bases no wheezing noted.  Poor inspiratory effort.   . Abdomen: Soft nontender nondistended with normal bowel sounds x4 quadrants. . Musculoskeletal: Compression stockings bilaterally in place.   Marland Kitchen Psychiatry: Mood is appropriate for condition and setting   Data Reviewed: CBC: Recent Labs  Lab 12/05/19 1007 12/05/19 1017  WBC 6.3  --   NEUTROABS 4.6  --   HGB 12.2 11.9*  HCT 38.4 35.0*  MCV 103.2*  --   PLT 214  --    Basic Metabolic Panel: Recent Labs  Lab 12/05/19 1007 12/05/19 1017  NA 146* 144  K 3.6 3.8  CL 113*  --   CO2 24  --   GLUCOSE 128*  --   BUN 16  --   CREATININE 1.03*  --   CALCIUM 8.3*  --    GFR: CrCl cannot be calculated (Unknown ideal weight.). Liver Function Tests: Recent Labs  Lab 12/05/19 1007  AST 26  ALT 23  ALKPHOS 44  BILITOT 0.8  PROT 5.1*  ALBUMIN 2.8*   No results for input(s): LIPASE, AMYLASE in the last 168 hours. No results for input(s): AMMONIA in the last 168 hours. Coagulation Profile: No results for input(s): INR, PROTIME in the last 168 hours. Cardiac Enzymes: No results for input(s): CKTOTAL, CKMB, CKMBINDEX, TROPONINI in the last 168 hours. BNP (last 3 results) No results for input(s): PROBNP in the last 8760 hours. HbA1C: No results for input(s): HGBA1C in the last 72  hours. CBG: Recent Labs  Lab 12/05/19 0953  GLUCAP 122*   Lipid Profile: Recent Labs    12/06/19 0414  CHOL 131  HDL 40*  LDLCALC 54  TRIG 425186*  CHOLHDL 3.3   Thyroid Function Tests: Recent Labs    12/05/19 1458  TSH 6.162*   Anemia Panel: Recent Labs    12/05/19 1458  VITAMINB12 528   Urine analysis:    Component Value Date/Time   COLORURINE YELLOW 12/05/2019 1600   APPEARANCEUR CLEAR 12/05/2019 1600   LABSPEC 1.013 12/05/2019 1600   PHURINE 6.0 12/05/2019 1600   GLUCOSEU NEGATIVE 12/05/2019 1600   HGBUR NEGATIVE 12/05/2019 1600   BILIRUBINUR NEGATIVE 12/05/2019 1600   KETONESUR NEGATIVE 12/05/2019 1600   PROTEINUR NEGATIVE 12/05/2019 1600   UROBILINOGEN 0.2 03/02/2010 1143   NITRITE NEGATIVE 12/05/2019 1600   LEUKOCYTESUR TRACE (A) 12/05/2019 1600   Sepsis Labs: @LABRCNTIP (procalcitonin:4,lacticidven:4)  ) Recent Results (from the past 240 hour(s))  SARS CORONAVIRUS 2 (TAT 6-24 HRS) Nasopharyngeal Nasopharyngeal Swab     Status: None   Collection Time: 12/05/19 10:07 AM   Specimen: Nasopharyngeal Swab  Result Value Ref Range Status   SARS Coronavirus 2 NEGATIVE NEGATIVE Final    Comment: (NOTE) SARS-CoV-2 target nucleic acids are NOT DETECTED. The SARS-CoV-2 RNA is generally detectable in upper and lower respiratory specimens during the acute phase of infection. Negative results do not preclude SARS-CoV-2 infection, do not rule out co-infections with other pathogens, and should not be used as the sole basis for treatment or other patient management decisions. Negative results must be combined with clinical observations, patient history, and epidemiological information. The expected result is Negative. Fact Sheet for Patients: HairSlick.nohttps://www.fda.gov/media/138098/download Fact Sheet for Healthcare Providers: quierodirigir.comhttps://www.fda.gov/media/138095/download This test is not yet approved or cleared by the [place] FDA and  has been authorized for  detection and/or diagnosis of SARS-CoV-2 by FDA under an Emergency Use Authorization (EUA). This EUA will remain  in effect (meaning this test can be used) for the duration of the COVID-19 declaration under Section 56 4(b)(1) of the Act, 21 U.S.C. section 360bbb-3(b)(1), unless the authorization is terminated or revoked sooner. Performed at [facility], [address] [place], [place] [address]       Studies: CT Head Wo Contrast  Result Date: 12/05/2019 CLINICAL DATA:  Weakness, altered mental status EXAM: CT HEAD WITHOUT CONTRAST TECHNIQUE: Contiguous axial images were obtained from the base of the skull through the vertex without intravenous contrast. COMPARISON:  02/14/2010 FINDINGS: Brain: Encephalomalacia within the right frontal lobe, superior aspect of the right cerebellum, and medial left cerebellum compatible with remote prior infarcts. Additional smaller remote lacunar infarcts in the bilateral basal ganglia. Extensive low-density changes within the periventricular and subcortical white matter compatible with chronic microvascular ischemic change. Moderate-advanced diffuse cerebral volume loss. No evidence of intracranial hemorrhage. No mass or mass effect. Vascular: Mild atherosclerotic calcifications involving the large vessels of the skull base. No unexpected hyperdense vessel. Skull: Normal.  Negative for fracture or focal lesion. Sinuses/Orbits: Mucosal thickening within the inferior left maxillary sinus. Remaining paranasal sinuses are clear. Orbital structures unremarkable. Other: None. IMPRESSION: 1. No acute intracranial abnormality evident by CT. Further evaluation can be performed with MRI, as clinically indicated. 2. Advanced chronic microvascular ischemic changes and cerebral volume loss. 3. Remote prior infarcts in the right frontal lobe, superior right cerebellum, and medial left cerebellum. 4. Left maxillary sinus disease. Electronically Signed   By: Davina Poke  M.D.   On: 12/05/2019 12:02   DG Chest Port 1 View  Result Date: 12/05/2019 CLINICAL DATA:  Altered mental status, weakness EXAM: PORTABLE CHEST 1 VIEW COMPARISON:  02/14/2010 FINDINGS: The heart size and mediastinal contours are within normal limits. Lung volumes are low. Streaky bibasilar opacities. No pleural effusion or pneumothorax. Degenerative changes of the shoulders, right greater than left. IMPRESSION: Low lung volumes with streaky bibasilar opacities, likely atelectasis. Electronically Signed   By: Davina Poke M.D.   On: 12/05/2019 11:08    Scheduled Meds: . clopidogrel  75 mg Oral Daily  . heparin  5,000 Units Subcutaneous Q12H  . levothyroxine  125 mcg Oral QAC breakfast  . lisinopril  20 mg Oral Daily  . simvastatin  20 mg Oral QPM    Continuous Infusions: . sodium chloride 75 mL/hr at 12/06/19 0451     LOS: 1 day     Kayleen Memos, MD [facility] Pager [phone]  If 7PM-7AM, please contact night-coverage [email or web address] Password [email or web address] 12/06/2019, 10:22 AM

## 2019-12-06 NOTE — Progress Notes (Signed)
Modified Barium Swallow Progress Note  Patient Details  Name: Jillian Gay MRN: [id number] Date of Birth: 1932/01/20  Today's Date: 12/06/2019  Modified Barium Swallow completed.  Full report located under Chart Review in the Imaging Section.  Brief recommendations include the following:  Clinical Impression  Pt presents with moderately-severe oral dysphagia and mild pharyngeal dysphagia with resultant deep laryngeal penetration of thin liquid, nectar-thick liquid, and honey-thick liquid on today's examination.    Laryngeal penetration was secondary to reduced laryngeal closure and delayed swallow iniation at the level of the pyriform sinuses.   Penetration did not always clear the laryngeal vestibule.  Of note, visibility of the laryngeal vestibule was obstructed by the pt's shoulder in this examination; therefore unable to definitively state whether trace aspiration occurred; however, pt is at an elevated risk for aspiration secondary to penetration not clearing the laryngeal vestibule.  Oral phase was remarkable for bolus holding with all consistencies, benefited by frequent presentation of a dry spoon to cue pt for AP transport and swallow initiation.  Despite cues, pt with moderate amounts of residue in her oral cavity at the completion of this examination.  Oral residue increased with increased viscosity of the trials, with thin liquid clearing the best.  She additionally exhibited anterior labial spillage, reduced lingual control resulting in premature spillage to the pyriform sinuses, and reduced lingual strength resulting in oral residue.  Suspect that cognitive-linguistic deficits impacted the pt's oral phase of the swallow.  Pharyngeal phase was remarkable for mildly reduced BOT retraction and reduced hyolaryngeal elevation/excursion resulting in trace vallecular residue.    Recommend NPO with frequent oral care and consideration of short-term alternative means of nutrition.  Pt may  have a few small ice chips or small spoon sips of thin liquid following thorough oral care to mobilize oropharyngeal swallow musculature and to help keep oral mucosa moistened.  She may also have essential meds crushed in puree with use of dry spoon, frequent cues for the pt to clear her oral cavity, and oral suction following administration.    Swallow Evaluation Recommendations       SLP Diet Recommendations: NPO;Alternative means - temporary;Ice chips PRN after oral care   Liquid Administration via: Spoon   Medication Administration: Via alternative means(or crushed in puree if necessary )   Supervision: Full supervision/cueing for compensatory strategies   Compensations: Small sips/bites;Slow rate;Lingual sweep for clearance of pocketing;Multiple dry swallows after each bite/sip   Postural Changes: Seated upright at 90 degrees   Oral Care Recommendations: Oral care QID;Staff/trained caregiver to provide oral care   Other Recommendations: Have oral suction available   Colin Mulders M.S., CCC-SLP Acute Rehabilitation Services Office: [phone]  Jillian Gay 12/06/2019,3:46 PM

## 2019-12-06 NOTE — Progress Notes (Signed)
Physical Therapy Treatment Patient Details Name: Jillian Gay MRN: [id number] DOB: Jun 03, 1932 Today's Date: 12/06/2019    History of Present Illness 85 yo female with onset of confusion and mental lethargy was admitted, has clear head CT but MRI pending.  Note acute metabolic encephalopathy, suspected UTI, now nonverbal and new non ambulatory status.  Covid (-).   PMHx:  stroke, thyroid disease, HTN, ambulatory with help,     PT Comments    Pt was seen with nursing to instruct a transfer to bed, eventually electing to do a sliding over transfer from chair to bed.  Pt was able to verbalize more once in bed, loud but unclear speech with a repetitive sound.  Follow acutely as tolerated for standing balance control, for sitting endurance and for tolerance of unsupported sitting with staff.  CIR is still indicated.  Follow Up Recommendations  CIR     Equipment Recommendations  None recommended by PT    Recommendations for Other Services Rehab consult     Precautions / Restrictions Precautions Precautions: Fall Precaution Comments: monitor HR, O2 sat and BP's Restrictions Weight Bearing Restrictions: No    Mobility  Bed Mobility Overal bed mobility: Needs Assistance Bed Mobility: Rolling;Sit to Supine Rolling: Max assist   Supine to sit: Max assist;+2 for physical assistance;+2 for safety/equipment Sit to supine: Max assist;+2 for physical assistance;+2 for safety/equipment   General bed mobility comments: return to bed with 3 assist  Transfers Overall transfer level: Needs assistance Equipment used: 2 person hand held assist Transfers: Lateral/Scoot Transfers;Sit to/from Stand Sit to Stand: Total assist        Lateral/Scoot Transfers: Total assist;+2 physical assistance;+2 safety/equipment;From elevated surface(3rd person to slide) General transfer comment: used stedy to support when PT and OT used bed pad to lift hips up to stand  Ambulation/Gait              General Gait Details: non ambulatory   Stairs             Wheelchair Mobility    Modified Rankin (Stroke Patients Only) Modified Rankin (Stroke Patients Only) Pre-Morbid Rankin Score: Moderate disability Modified Rankin: Severe disability     Balance Overall balance assessment: Needs assistance Sitting-balance support: Feet supported;Bilateral upper extremity supported Sitting balance-Leahy Scale: Poor   Postural control: Posterior lean;Right lateral lean Standing balance support: Bilateral upper extremity supported;During functional activity Standing balance-Leahy Scale: Zero                              Cognition Arousal/Alertness: Awake/alert;Lethargic Behavior During Therapy: Flat affect Overall Cognitive Status: No family/caregiver present to determine baseline cognitive functioning                                 General Comments: unclear if pt is different from baseline but chart indicates so      Exercises      General Comments General comments (skin integrity, edema, etc.): pt was quite tired for standing, did a lateral sliding transfer from partial sitting toward L side with 3 staff members      Pertinent Vitals/Pain Pain Assessment: Faces Faces Pain Scale: No hurt    Home Living Family/patient expects to be discharged to:: Private residence Living Arrangements: Spouse/significant other Available Help at Discharge: Family;Available 24 hours/day Type of Home: House         Additional Comments: pt is  unable to give any home living history    Prior Function Level of Independence: Needs assistance  Gait / Transfers Assistance Needed: per chart walked with help post stroke ADL's / Homemaking Assistance Needed: husband is home to do cooking and cleaning, pt cannot state     PT Goals (current goals can now be found in the care plan section) Acute Rehab PT Goals Patient Stated Goal: none stated, non verbal PT Goal  Formulation: Patient unable to participate in goal setting Time For Goal Achievement: 12/20/19 Potential to Achieve Goals: Good Progress towards PT goals: Progressing toward goals    Frequency    Min 3X/week      PT Plan Current plan remains appropriate    Co-evaluation              AM-PAC PT "6 Clicks" Mobility   Outcome Measure  Help needed turning from your back to your side while in a flat bed without using bedrails?: A Lot Help needed moving from lying on your back to sitting on the side of a flat bed without using bedrails?: A Lot Help needed moving to and from a bed to a chair (including a wheelchair)?: A Lot Help needed standing up from a chair using your arms (e.g., wheelchair or bedside chair)?: A Lot Help needed to walk in hospital room?: Total Help needed climbing 3-5 steps with a railing? : Total 6 Click Score: 10    End of Session Equipment Utilized During Treatment: Gait belt Activity Tolerance: Patient limited by fatigue Patient left: in bed;with call bell/phone within reach;with bed alarm set;with nursing/sitter in room Nurse Communication: Mobility status;Need for lift equipment PT Visit Diagnosis: Unsteadiness on feet (R26.81);Muscle weakness (generalized) (M62.81);Other abnormalities of gait and mobility (R26.89);Difficulty in walking, not elsewhere classified (R26.2)     Time: 8891-6945 PT Time Calculation (min) (ACUTE ONLY): 16 min  Charges:  $Therapeutic Activity: 8-22 mins                    Ramond Dial 12/06/2019, 2:10 PM   Mee Hives, PT MS Acute Rehab Dept. Number: [place] [facility] and [place]

## 2019-12-06 NOTE — Procedures (Signed)
ELECTROENCEPHALOGRAM REPORT   Patient: Jillian Gay       Room #: 1E56D EEG No. ID: [id number] Age: 85 y.o.        Sex: female Referring Physician: [place] Crane Report Date:  12/06/2019        Interpreting Physician: Alexis Goodell  History: Jillian Gay is an 85 y.o. female with altered mental status  Medications:  Rocephin, Plavix, Synthroid, Zocor  Conditions of Recording:  This is a 21 channel routine scalp EEG performed with bipolar and monopolar montages arranged in accordance to the international 10/20 system of electrode placement. One channel was dedicated to EKG recording.  The patient is in the awake and drowsy states.  Description:  Artifact is prominent during the recording often obscuring the background rhythm. When able to be visualized the posterior background rhythm reached a maximum of 8 Hz alpha activity although for much of the recording it was actually slower with an underlying polymorphic delta activity noted that slowed the rhythm.  The patient drowses with further slowing noted. Also noted are intermittent periodic discharges of triphasic morphology. Hyperventilation and intermittent photic stimulation were not performed.  IMPRESSION: This is an abnormal electroencephalogram secondary to background slowing with occasional triphasic waves consistent with an encephalopathy of nonspecific etiology.     Alexis Goodell, MD Neurology [phone] 12/06/2019, 5:47 PM

## 2019-12-06 NOTE — Plan of Care (Signed)
  Problem: Clinical Measurements: Goal: Respiratory complications will improve Outcome: Progressing Note: Stable on room air.  No s/s of respiratory complications noted. Goal: Cardiovascular complication will be avoided Outcome: Progressing Note: NSR on telemetry.  No s/s of cardiovascular complication noted.   Problem: Elimination: Goal: Will not experience complications related to urinary retention Outcome: Progressing Note: No s/s of urinary retention noted.  Patient has adequate urine output.

## 2019-12-06 NOTE — Progress Notes (Signed)
Pt returned to bed from sitting up in chair for 1 hour. Leaving unit via bed for barium swallow test.

## 2019-12-06 NOTE — Evaluation (Signed)
Clinical/Bedside Swallow Evaluation Patient Details  Name: Jillian Gay MRN: [id number] Date of Birth: 1932-01-31  Today's Date: 12/06/2019 Time: SLP Start Time (ACUTE ONLY): 1030 SLP Stop Time (ACUTE ONLY): 1045 SLP Time Calculation (min) (ACUTE ONLY): 15 min  Past Medical History:  Past Medical History:  Diagnosis Date  . Allergy   . Hypertension   . Stroke ([place])   . Thyroid disease    Past Surgical History: No past surgical history on file. HPI:  Jillian Gay is a 85 y.o. female with medical history significant of stroke with residual right-sided weakness, aphasia, slurred speech, hypertension hyperlipidemia, thyroidism, presented with running of her weakness for 7 days and change in mental status for 2 days.   Granddaughter reported choking event piror to admission.   Head CT on 12/18 reported "No acute intracranial abnormality evident by CT."  Waiting on MRI results.  CXR on 12/18 reported "Low lung volumes with streaky bibasilar opacities, likely atelectasis".    Assessment / Plan / Recommendation Clinical Impression  Pt presents with oral dysphagia and suspected pharyngeal dysphagia.  Pt was observed with trials of ice chips, thin liquid, and puree.  She exhibited good bolus acceptance with independent labial closure around the spoon and straw with all trials.  She exhibited significantly prolonged AP transport with bolus holding and suspected delayed swallow initiation.  Pt benefited from cues to initiate swallow.  She presented with multiple swallows per bolus, possibly suggestive of pharyngeal residue, and she exhibited an immediate throat clear following most po trials.  Pt with difficulty following command to open her mouth after po trials secondary to receptive language deficits; however, suspect oral residue.  Recommend an instrumental swallow study to further evaluate swallow function and to help determine the safest/least restrictive diet.  Until instrumental swallow study  can be completed, recommend continuation of NPO with frequent oral care.  Pt may have essential medications crushed in puree with full supervision to cue for swallow initiation and to check that oral cavity is cleared.  Pt may also have a few small ice chips following thorough oral care for comfort and to mobilize swallow function as tolerated.    SLP Visit Diagnosis: Dysphagia, unspecified (R13.10)    Aspiration Risk  Moderate aspiration risk    Diet Recommendation NPO;Ice chips PRN after oral care   Medication Administration: Crushed with puree Supervision: Full supervision/cueing for compensatory strategies    Other  Recommendations Oral Care Recommendations: Oral care QID;Staff/trained caregiver to provide oral care Other Recommendations: Have oral suction available   Follow up Recommendations Skilled Nursing facility;Home health SLP;24 hour supervision/assistance      Frequency and Duration min 2x/week  2 weeks       Prognosis Prognosis for Safe Diet Advancement: Fair Barriers to Reach Goals: Language deficits      Swallow Study   General HPI: Jillian Gay is a 85 y.o. female with medical history significant of stroke with residual right-sided weakness, aphasia, slurred speech, hypertension hyperlipidemia, thyroidism, presented with running of her weakness for 7 days and change in mental status for 2 days.   Granddaughter reported choking event piror to admission.   Head CT on 12/18 reported "No acute intracranial abnormality evident by CT."  Waiting on MRI results.  CXR on 12/18 reported "Low lung volumes with streaky bibasilar opacities, likely atelectasis".  Type of Study: Bedside Swallow Evaluation Previous Swallow Assessment: None documented  Diet Prior to this Study: NPO Temperature Spikes Noted: Yes Respiratory Status: Room air  History of Recent Intubation: No Behavior/Cognition: Alert;Requires cueing;Doesn't follow directions Oral Care Completed by SLP: No Oral  [place] - Dentition: Adequate natural dentition;Poor condition Self-Feeding Abilities: Needs assist Patient Positioning: Upright in bed Baseline Vocal Quality: Normal Volitional Cough: Cognitively unable to elicit Volitional Swallow: Unable to elicit    Oral/Motor/Sensory Function Overall Oral Motor/Sensory Function: (Difficult to evaluate given aphasia )   Ice Chips Ice chips: Impaired Presentation: Spoon Oral Phase Impairments: Reduced lingual movement/coordination;Impaired mastication Oral Phase Functional Implications: Prolonged oral transit;Oral holding Pharyngeal Phase Impairments: Suspected delayed Swallow;Throat Clearing - Delayed;Multiple swallows   Thin Liquid Thin Liquid: Impaired Presentation: Spoon;Straw Oral Phase Impairments: Reduced lingual movement/coordination Oral Phase Functional Implications: Prolonged oral transit;Oral holding Pharyngeal  Phase Impairments: Multiple swallows;Throat Clearing - Immediate;Suspected delayed Swallow    Nectar Thick Nectar Thick Liquid: Not tested   Honey Thick Honey Thick Liquid: Not tested   Puree Puree: Impaired Presentation: Spoon Oral Phase Impairments: Reduced lingual movement/coordination Oral Phase Functional Implications: Prolonged oral transit;Oral residue;Oral holding Pharyngeal Phase Impairments: Suspected delayed Swallow;Multiple swallows;Throat Clearing - Immediate   Solid     Solid: Not tested     Villa Herb M.S., CCC-SLP Acute Rehabilitation Services Office: [phone]   Shanon Rosser Joclynn Lumb 12/06/2019,11:23 AM

## 2019-12-06 NOTE — Consult Note (Addendum)
NEURO HOSPITALIST CONSULT NOTE   Requestig physician: Dr. Margo AyeHall   Reason for Consult: Altered Mental Status    History obtained from:  Chart and Husband  HPI:                                                                                                                                          Jillian Gay is an 85 y.o. female with a history of stroke with residual right side weakness and slurred speech. Her husband reports her stroke occurred on February 16, 2000. Home medications include plavix for stroke prevention. She also has a history of hypertension on lisinopril, dyslipidemia on simvastatin and hypothyroid.   According to her husband she is normally able to speak in full sentences normally, however it is slurred. She is oriented normally to place, date and situation. She has a contracted right hand baseline. She is not normally to ambulate, but normally can transfer with one person assist. Over the past week she has developed increased weakness and lost her ability to transfer from the bed to a wheelchair, it was taking 3 family members to help her. Then around Tuesday she became less talkative and began to have confusion. Husband said she did not seem to recognize family on Thursday or Friday and was starring into space. She initially had episodes that would come and go She is now nearly non-verbal for the past two days.   Her husband reports that she feel out of bed on 11/22/19 and hit her head on a dresser. Her head seemed swollen and he used ice on it. He is worried that has contributed to her current state.   Head CT was negative for acute stroke. CT did show remote prior infarcts in the right frontal lobe, superior right cerebellum and medial left cerebellum. Attempted MRI Brain, however they were unable to complete due to her back pain. According to family and nursing notes she has a compound fracture in her lumbar spine. Imaging of the lumbar spine from 11/10/19  showed osteopenia versus compression fracture of the inferior endplate of T12.  Chest x-ray showed no evidence of pneumonia. Sars-CoV-2 not detected. Presumed UTI treated empirically with rocephin, culture enterococcus species. HgbA1C 6.3. LDL 54. TSH 6.1  Past Medical History:  Diagnosis Date  . Allergy   . Hypertension   . Stroke ([facility])   . Thyroid disease     No past surgical history on file.  Social History:  reports that she quit smoking about 27 years ago. Her smoking use included cigarettes. She does not have any smokeless tobacco history on file. No history on file for alcohol and drug.  Allergies  Allergen Reactions  . Celebrex [Celecoxib]   . Macrodantin   . Metoprolol   . Norvasc [  Amlodipine Besylate]     MEDICATIONS:                                                                                                                     I have reviewed the patient's current medications.   ROS:                                                                                                                                       History obtained from unobtainable from patient due to mental status  Blood pressure (!) 160/77, pulse 73, temperature 98.4 F (36.9 C), temperature source Axillary, resp. rate 18, SpO2 97 %.   General Examination:                                                                                                       Physical Exam  HEENT-  Normocephalic, no lesions, without obvious abnormality.  Normal external eye and conjunctiva.   Cardiovascular-  pulses palpable throughout   Lungs- no excessive working breathing.  Abdomen- All 4 quadrants palpated and nontender Extremities- Warm and dry, Musculoskeletal-no joint tenderness, deformity or swelling Skin-warm and dry, lower extremities wrapped with dressings   Neurological Examination Mental Status: Not following any verbal commands. Was able to close eyes and lift upper extremities with  demonstration. No attempts at speech besides moaning Cranial Nerves: Visual fields appear grossly normal,  extra-ocular motions intact.  Motor: Right upper extremity, able to hold off bed, but drifts. Left upper extremity holds up with minimal drift. She would not participate in any strength testing. Right hand appears contracted, unable to open.  She lifted both upper extremities when shown and asked to do so with demonstration. She would not move lower extremities, she did however respond to noxious stimuli in both lower extremities and moved them. She never lifted them off the bed.     Lab Results: Basic Metabolic Panel: Recent Labs  Lab 12/05/19 1007 12/05/19 1017  NA 146* 144  K 3.6 3.8  CL 113*  --   CO2 24  --   GLUCOSE 128*  --   BUN 16  --   CREATININE 1.03*  --   CALCIUM 8.3*  --     CBC: Recent Labs  Lab 12/05/19 1007 12/05/19 1017  WBC 6.3  --   NEUTROABS 4.6  --   HGB 12.2 11.9*  HCT 38.4 35.0*  MCV 103.2*  --   PLT 214  --     Cardiac Enzymes: No results for input(s): CKTOTAL, CKMB, CKMBINDEX, TROPONINI in the last 168 hours.  Lipid Panel: Recent Labs  Lab 12/06/19 0414  CHOL 131  TRIG 186*  HDL 40*  CHOLHDL 3.3  VLDL 37  LDLCALC 54    Imaging: CT Head Wo Contrast  Result Date: 12/05/2019 CLINICAL DATA:  Weakness, altered mental status EXAM: CT HEAD WITHOUT CONTRAST TECHNIQUE: Contiguous axial images were obtained from the base of the skull through the vertex without intravenous contrast. COMPARISON:  02/14/2010 FINDINGS: Brain: Encephalomalacia within the right frontal lobe, superior aspect of the right cerebellum, and medial left cerebellum compatible with remote prior infarcts. Additional smaller remote lacunar infarcts in the bilateral basal ganglia. Extensive low-density changes within the periventricular and subcortical white matter compatible with chronic microvascular ischemic change. Moderate-advanced diffuse cerebral volume loss. No  evidence of intracranial hemorrhage. No mass or mass effect. Vascular: Mild atherosclerotic calcifications involving the large vessels of the skull base. No unexpected hyperdense vessel. Skull: Normal. Negative for fracture or focal lesion. Sinuses/Orbits: Mucosal thickening within the inferior left maxillary sinus. Remaining paranasal sinuses are clear. Orbital structures unremarkable. Other: None. IMPRESSION: 1. No acute intracranial abnormality evident by CT. Further evaluation can be performed with MRI, as clinically indicated. 2. Advanced chronic microvascular ischemic changes and cerebral volume loss. 3. Remote prior infarcts in the right frontal lobe, superior right cerebellum, and medial left cerebellum. 4. Left maxillary sinus disease. Electronically Signed   By: Davina Poke M.D.   On: 12/05/2019 12:02   DG Chest Port 1 View  Result Date: 12/05/2019 CLINICAL DATA:  Altered mental status, weakness EXAM: PORTABLE CHEST 1 VIEW COMPARISON:  02/14/2010 FINDINGS: The heart size and mediastinal contours are within normal limits. Lung volumes are low. Streaky bibasilar opacities. No pleural effusion or pneumothorax. Degenerative changes of the shoulders, right greater than left. IMPRESSION: Low lung volumes with streaky bibasilar opacities, likely atelectasis. Electronically Signed   By: Davina Poke M.D.   On: 12/05/2019 11:08    Assessment: 85 y.o. female with history of stroke in 2001, hypertension, dyslipidemia and hypothyroid. Baseline able to transfer with 1 person assist, not able to ambulate. Contracted right hand. Able to hold conversation and oriented, but slurred speech. Over the past week she has became weaker at home and required 3 people to transfer. Over the past several days she has developed confusion and is now not speaking or following verbal commands.   CT Head: No Acute Stroke Urine Culture: enterococcus species.  HgbA1C 6.3.  LDL 54  TSH 6.1 B12: normal    Impression: Altered Mental Status: unclear cause: possible UTI vs Stroke vs Seizure  Recommendations: --UTI currently being treated with antibotics --EEG being placed this afternoon to rule out seizure --Possible Stroke: CT negative for acute stroke. Was unable to tolerate MRI due to back pain today. Consider medications to help with pain/sedation to complete MRI --Hypothyroid: Currently TSH 6.1. On synthroid, managed by primary team --Hypertension: Currently on lisinopril --Dyslipidemia: Currently on simvastatin  --  Stroke Prevention: Currently on home dose of plavix  Spoke with husband to discuss baseline status and updated him regarding neurology consult.   Cathrine Muster DNP, FNP-C [facility] Nurse Practitioner    12/06/2019, 3:15 PM   NEUROHOSPITALIST ADDENDUM Performed a face to face diagnostic evaluation.   I have reviewed the contents of history and physical exam as documented by PA/ARNP/Resident and agree with above documentation.  I have discussed and formulated the above plan as documented. Edits to the note have been made as needed.  Neurology consulted for altered mental status, progressive weakness over the last week.  On exam, patient is lethargic but will awaken to sternal rub and tells me her name.  Unable to answer other questions.  No forced gaze deviation, nystagmus.  Fixes eyes on examiner.  No obvious facial droop.  Follows simple commands like sticking her tongue out.  Raises both arms against gravity, has chronic contracted left arm.  Minimal withdrawal in both lower legs.  CT head was unremarkable for new stroke, however has multiple old infarcts with areas of encephalomalacia, advanced chronic microvascular changes and volume loss.  Work-up revealed urinary tract infection as well as patient hypothyroid with TSH of 6.1. EEG was obtained today: Reviewed, no epileptiform discharges seen.  Report pending MRI brain was attempted however, patient  could not tolerate . Impression Likely toxic metabolic encephalopathy in the setting of urinary tract infection, hypothyroidism and patient with prior strokes and cerebral volume loss  Recommendations Obtain MRI brain when possible  Continue to treat infection and underlying metabolic disturbances such as UTI, low thyroid Check ammonia level    Georgiana Spinner Benz Vandenberghe MD [facility] [phone]   If 7pm to 7am, please call on call as listed on AMION.

## 2019-12-06 NOTE — Evaluation (Signed)
Physical Therapy Evaluation Patient Details Name: Jillian Gay MRN: [id number] DOB: 10-Oct-1932 Today's Date: 12/06/2019   History of Present Illness  85 yo female with onset of confusion and mental lethargy was admitted, has clear head CT but MRI pending.  Note acute metabolic encephalopathy, suspected UTI, now nonverbal and new non ambulatory status.  Covid (-).   PMHx:  stroke, thyroid disease, HTN, ambulatory with help,   Clinical Impression  Pt was seen for mobility at bedside, able to sit up with cues and prompts from PT and OT.  Pt requires min to mod assist sitting to control balance, with occasional moments of min guard due to controlling trunk.  Pt is able to be stood from bed with bed pad and stedy, transferred to chair and instructed nursing for return.  Pt is propped with lateral support to increase her ability to sit with no LOB.  Nursing will contact PT if needed for return to bed.    Follow Up Recommendations CIR    Equipment Recommendations  None recommended by PT    Recommendations for Other Services Rehab consult     Precautions / Restrictions Precautions Precautions: Fall Precaution Comments: monitor HR, O2 sat and BP's Restrictions Weight Bearing Restrictions: No      Mobility  Bed Mobility Overal bed mobility: Needs Assistance Bed Mobility: Rolling;Supine to Sit;Sit to Supine Rolling: Max assist   Supine to sit: Max assist;+2 for physical assistance;+2 for safety/equipment Sit to supine: Max assist;+2 for physical assistance;+2 for safety/equipment   General bed mobility comments: return to bed with 3 assist  Transfers Overall transfer level: Needs assistance Equipment used: Rolling walker (2 wheeled);2 person hand held assist Transfers: Sit to/from Stand Sit to Stand: +2 physical assistance;+2 safety/equipment;From elevated surface;Max assist         General transfer comment: used stedy to support when PT and OT used bed pad to lift hips up to  stand  Ambulation/Gait             General Gait Details: non ambulatory  Stairs            Wheelchair Mobility    Modified Rankin (Stroke Patients Only) Modified Rankin (Stroke Patients Only) Pre-Morbid Rankin Score: Moderate disability Modified Rankin: Severe disability     Balance Overall balance assessment: Needs assistance Sitting-balance support: Feet supported;Bilateral upper extremity supported Sitting balance-Leahy Scale: Poor   Postural control: Posterior lean;Right lateral lean Standing balance support: Bilateral upper extremity supported;During functional activity Standing balance-Leahy Scale: Zero                               Pertinent Vitals/Pain Pain Assessment: Faces Faces Pain Scale: No hurt    Home Living Family/patient expects to be discharged to:: Private residence Living Arrangements: Spouse/significant other Available Help at Discharge: Family;Available 24 hours/day Type of Home: House           Additional Comments: pt is unable to give any home living history    Prior Function Level of Independence: Needs assistance   Gait / Transfers Assistance Needed: per chart walked with help post stroke  ADL's / Homemaking Assistance Needed: husband is home to do cooking and cleaning, pt cannot state        Hand Dominance        Extremity/Trunk Assessment   Upper Extremity Assessment Upper Extremity Assessment: Defer to OT evaluation    Lower Extremity Assessment Lower Extremity Assessment: Generalized weakness(hips  are 1+, knees are 2- to 3-, ankles are 3-)    Cervical / Trunk Assessment Cervical / Trunk Assessment: Kyphotic  Communication   Communication: Expressive difficulties  Cognition Arousal/Alertness: Awake/alert Behavior During Therapy: Flat affect Overall Cognitive Status: No family/caregiver present to determine baseline cognitive functioning                                 General  Comments: unclear if pt is different from baseline but chart indicates so      General Comments General comments (skin integrity, edema, etc.): pt used stedy with help to pull up and listed forward on bar with no effort to extend trunk    Exercises     Assessment/Plan    PT Assessment Patient needs continued PT services  PT Problem List Decreased strength;Decreased range of motion;Decreased activity tolerance;Decreased balance;Decreased mobility;Decreased coordination;Decreased cognition;Decreased knowledge of use of DME;Decreased safety awareness;Cardiopulmonary status limiting activity;Decreased knowledge of precautions;Obesity       PT Treatment Interventions DME instruction;Gait training;Functional mobility training;Therapeutic activities;Therapeutic exercise;Balance training;Neuromuscular re-education;Patient/family education    PT Goals (Current goals can be found in the Care Plan section)  Acute Rehab PT Goals Patient Stated Goal: none stated, non verbal PT Goal Formulation: Patient unable to participate in goal setting Time For Goal Achievement: 12/20/19 Potential to Achieve Goals: Good    Frequency Min 3X/week   Barriers to discharge Decreased caregiver support home with husband who will not be able to assist alone    Co-evaluation               AM-PAC PT "6 Clicks" Mobility  Outcome Measure Help needed turning from your back to your side while in a flat bed without using bedrails?: A Lot Help needed moving from lying on your back to sitting on the side of a flat bed without using bedrails?: A Lot Help needed moving to and from a bed to a chair (including a wheelchair)?: A Lot Help needed standing up from a chair using your arms (e.g., wheelchair or bedside chair)?: A Lot Help needed to walk in hospital room?: Total Help needed climbing 3-5 steps with a railing? : Total 6 Click Score: 10    End of Session Equipment Utilized During Treatment: Gait  belt Activity Tolerance: Patient limited by fatigue Patient left: with call bell/phone within reach;in chair;with chair alarm set Nurse Communication: Mobility status;Need for lift equipment PT Visit Diagnosis: Unsteadiness on feet (R26.81);Muscle weakness (generalized) (M62.81);Other abnormalities of gait and mobility (R26.89);Difficulty in walking, not elsewhere classified (R26.2)    Time: 1761-6073 PT Time Calculation (min) (ACUTE ONLY): 44 min   Charges:   PT Evaluation $PT Eval Moderate Complexity: 1 Mod PT Treatments $Therapeutic Activity: 8-22 mins       Ramond Dial 12/06/2019, 2:01 PM   Mee Hives, PT MS Acute Rehab Dept. Number: [place]

## 2019-12-06 NOTE — Progress Notes (Signed)
Updated the patient's spouse Mr. Jillian Gay via phone.  All questions answered to his satisfaction.

## 2019-12-06 NOTE — Evaluation (Signed)
Speech Language Pathology Evaluation Patient Details Name: Jillian Gay MRN: [id number] DOB: 1932-07-03 Today's Date: 12/06/2019 Time: 1015-1029 SLP Time Calculation (min) (ACUTE ONLY): 14 min  Problem List:  Patient Active Problem List   Diagnosis Date Noted  . Altered mental state 12/05/2019  . AMS (altered mental status) 12/05/2019  . 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 Neurogenic bladder 02/06/2012   Past Medical History:  Past Medical History:  Diagnosis Date  . Allergy   . Hypertension   . Stroke (HCC)   . Thyroid disease    Past Surgical History: No past surgical history on file. HPI:  Jillian Gay is a 85 y.o. female with medical history significant of stroke with residual right-sided weakness, aphasia, slurred speech, hypertension hyperlipidemia, thyroidism, presented with running of her weakness for 7 days and change in mental status for 2 days.   Granddaughter reported choking event piror to admission.   Head CT on 12/18 reported "No acute intracranial abnormality evident by CT."  Waiting on MRI results.  CXR on 12/18 reported "Low lung volumes with streaky bibasilar opacities, likely atelectasis".     Assessment / Plan / Recommendation Clinical Impression  Pt was seen for a cognitive-linguistic evaluation in the setting of a possible CVA.  Pt was encountered awake/alert with observed R gaze preference.  Pt with reported baseline aphasia and dysarthria secondary to previous CVA; however, no family present at this time to help determine baseline vs acute deficits.  Pt presents with global aphasia c/b receptive and expressive language deficits including difficulty with answering yes/no questions, following commands, confrontational naming, and repetition.  Pt was able to state her name independently x1 given extra time and she exhibited a relative strength in word repetition.  Pt with delayed processing and response time for all tasks.  Verbal  perseveration was observed at the word and phrase level.  Speech was >80% intelligible to an unfamiliar listener and it was observed to be slowed and laborious.  Cognition was not evaluated on this date secondary to language deficits.  Recommend additional ST targeting aphasia and dysarthria at next level of care.  SLP will f/u for treatment per POC.    SLP Assessment  SLP Recommendation/Assessment: Patient needs continued Speech Lanaguage Pathology Services    Follow Up Recommendations  Skilled Nursing facility;Home health SLP;24 hour supervision/assistance    Frequency and Duration min 2x/week  2 weeks      SLP Evaluation Cognition  Overall Cognitive Status: Difficult to assess Arousal/Alertness: Awake/alert Orientation Level: Oriented to person       Comprehension  Auditory Comprehension Overall Auditory Comprehension: Impaired Yes/No Questions: Impaired Basic Biographical Questions: 0-25% accurate Commands: Impaired One Step Basic Commands: 0-24% accurate Conversation: Simple Interfering Components: Processing speed EffectiveTechniques: Extra processing time;Repetition;Slowed speech    Expression Expression Primary Mode of Expression: Verbal Verbal Expression Overall Verbal Expression: Impaired at baseline Initiation: Impaired Automatic Speech: Name Level of Generative/Spontaneous Verbalization: Phrase Repetition: Impaired Level of Impairment: Word level Naming: Impairment Responsive: Not tested Confrontation: Impaired Convergent: 0-24% accurate Divergent: Not tested Verbal Errors: Perseveration;Not aware of errors;Neologisms   Oral / Motor  Oral Motor/Sensory Function Overall Oral Motor/Sensory Function: (Difficult to evaluate given aphasia ) Motor Speech Overall Motor Speech: Impaired at baseline Intelligibility: Intelligibility reduced Word: 75-100% accurate Phrase: 75-100% accurate   GO                   Villa Herb., M.S., CCC-SLP Acute Rehabilitation  Services Office: [phone]  Elvia Collum Roland Lipke 12/06/2019, 11:12 AM

## 2019-12-07 LAB — BASIC METABOLIC PANEL
Anion gap: 10 (ref 5–15)
BUN: 12 mg/dL (ref 8–23)
CO2: 27 mmol/L (ref 22–32)
Calcium: 8.7 mg/dL — ABNORMAL LOW (ref 8.9–10.3)
Chloride: 107 mmol/L (ref 98–111)
Creatinine, Ser: 0.81 mg/dL (ref 0.44–1.00)
GFR calc Af Amer: >60 mL/min (ref >60)
GFR calc non Af Amer: >60 mL/min (ref >60)
Glucose, Bld: 122 mg/dL — ABNORMAL HIGH (ref 70–99)
Potassium: 3.6 mmol/L (ref 3.5–5.1)
Sodium: 144 mmol/L (ref 135–145)

## 2019-12-07 LAB — MAGNESIUM: Magnesium: 2.1 mg/dL (ref 1.7–2.4)

## 2019-12-07 MED ORDER — ENALAPRILAT 1.25 MG/ML IV SOLN
0.6250 mg | Freq: Four times a day (QID) | INTRAVENOUS | Status: DC
  Administered 2019-12-07 – 2019-12-09 (×10): 0.625 mg via INTRAVENOUS
  Filled 2019-12-07 (×12): qty 0.5

## 2019-12-07 MED ORDER — LORAZEPAM 2 MG/ML IJ SOLN: 1.0000 mg | INTRAMUSCULAR | Status: DC | PRN

## 2019-12-07 MED ORDER — ORAL CARE MOUTH RINSE
15.0000 mL | Freq: Two times a day (BID) | OROMUCOSAL | Status: DC
  Administered 2019-12-07 – 2019-12-13 (×9): 15 mL via OROMUCOSAL

## 2019-12-07 MED ORDER — CHLORHEXIDINE GLUCONATE 0.12 % MT SOLN
15.0000 mL | Freq: Two times a day (BID) | OROMUCOSAL | Status: DC
  Administered 2019-12-07 – 2019-12-14 (×12): 15 mL via OROMUCOSAL
  Filled 2019-12-07 (×12): qty 15

## 2019-12-07 MED ORDER — LEVOTHYROXINE SODIUM 100 MCG/5ML IV SOLN
75.0000 ug | Freq: Every day | INTRAVENOUS | Status: DC
  Administered 2019-12-07 – 2019-12-10 (×4): 75 ug via INTRAVENOUS
  Filled 2019-12-07 (×4): qty 5

## 2019-12-07 MED ORDER — MORPHINE SULFATE (PF) 2 MG/ML IV SOLN
1.0000 mg | Freq: Once | INTRAVENOUS | Status: AC
  Administered 2019-12-08: 1 mg via INTRAVENOUS
  Filled 2019-12-07: qty 1

## 2019-12-07 MED ORDER — DEXTROSE IN LACTATED RINGERS 5 % IV SOLN: INTRAVENOUS | Status: DC

## 2019-12-07 NOTE — Progress Notes (Addendum)
PROGRESS NOTE  BRAYA HABERMEHL MHD:[id number] DOB: Sep 23, 1932 DOA: 12/05/2019 PCP: Geoffry Paradise, MD  HPI/Recap of past 63 hours: 85 year old female with past medical history significant for CVA with residual right-sided weakness, slurred speech, hypertension, hyperlipidemia, hypothyroidism, who presented with change in mental status, unable to recognize family members since yesterday.  Concern for new CVA, CT head done negative for any acute intracranial findings, showed old CVA.  MRI brain ordered and pending.  Admitted for AMS/CVA work-up.  12/07/19: Seen and examined.  Nonverbal at the time of this visit.  Per neurology and PM bedside RN patient has stated her name.  MRI brain will be attempted today.  IV morphine PRN for presumed back pain.   Assessment/Plan: Active Problems:   Altered mental state   AMS (altered mental status)  Acute metabolic encephalopathy, unclear etiology CT head showed old CVA with no acute intracranial findings MRI brain ordered and pending; unable to complete MRI brain on 12/06/2019 due to back pain. Reattempt MRI brain today. Elevated TSH 6, free T4 noirmal Continue home levothyroxine, switched to IV due to NPO Independently reviewed chest x-ray done on admission which showed bibasilar atelectasis.  Lower back pain, unclear etiology Obtain MRI lumbar spine. IV morphine 1 mg once for back pain  Presumed UTI, not confirmed WBC UA 11-20>> UCX re-incubated for better growth Patient is not able to provide any history due to altered mental status Treating empirically with Rocephin started 12/19>> Will narrow down or discontinue antibiotics per urine culture results.  History of CVA with right sided weakness PT OT to assess once more alert and follows commands PT OT rec CIR LDL 54 Continue Plavix and statin Continue PT OT with assistance and fall precautions.  Dysphagia N.p.o. Speech therapist following While n.p.o. start LR D5w at 60  cc/h Switch oral medications to IV  Subclinical hypothyroidism TSH elevated 6 Free T4 normal Continue levothyroxine, switched to IV due to NPO  Essential hypertension Blood pressure is not at goal Lisinopril on hold due to NPO Vasotec IV TID Continue to monitor vital signs  DVT prophylaxis: Heparin subcu BID  Code Status: DNR Family Communication:  Updated husband and granddaughter on 12/06/19.  Will call again.   Disposition Plan:  Unknown at this time due to ongoing work-up.  Patient will require at least 2 midnights for further evaluation and treatment of present condition.    Objective: Vitals:   12/07/19 0248 12/07/19 0502 12/07/19 0812 12/07/19 0820  BP:  (!) 161/58 (!) 154/69 (!) 174/72  Pulse: 60 (!) 54 (!) 55 (!) 57  Resp: 19 (!) 21 16 13   Temp:  98.4 F (36.9 C)    TempSrc:  Axillary    SpO2: 98% 99% 96% 97%  Weight:  92.3 kg    Height:        Intake/Output Summary (Last 24 hours) at 12/07/2019 1220 Last data filed at 12/07/2019 0500 Gross per 24 hour  Intake 100 ml  Output 825 ml  Net -725 ml   Filed Weights   12/06/19 2014 12/07/19 0502  Weight: 91.2 kg 92.3 kg    Exam:  . General: 85 y.o. year-old female well-developed and nourished in no distress. Alert but nonverbal at the time of this visit.  . Cardiovascular: Regular rate and rhythm no rubs or gallops. No JVD or thyromegaly noted. 85 Respiratory: Clear to auscultation no wheezes no rales. Poor respiratory effort.  . Abdomen: Soft nontender normal bowel sounds present. . Musculoskeletal: Compression stockings bilateral in  place.  . Psychiatry: Mood is appropriate for condition and setting.  Data Reviewed: CBC: Recent Labs  Lab 12/05/19 1007 12/05/19 1017  WBC 6.3  --   NEUTROABS 4.6  --   HGB 12.2 11.9*  HCT 38.4 35.0*  MCV 103.2*  --   PLT 214  --    Basic Metabolic Panel: Recent Labs  Lab 12/05/19 1007 12/05/19 1017 12/07/19 0342  NA 146* 144 144  K 3.6 3.8 3.6  CL 113*   --  107  CO2 24  --  27  GLUCOSE 128*  --  122*  BUN 16  --  12  CREATININE 1.03*  --  0.81  CALCIUM 8.3*  --  8.7*  MG  --   --  2.1   GFR: Estimated Creatinine Clearance: 57 mL/min (by C-G formula based on SCr of 0.81 mg/dL). Liver Function Tests: Recent Labs  Lab 12/05/19 1007  AST 26  ALT 23  ALKPHOS 44  BILITOT 0.8  PROT 5.1*  ALBUMIN 2.8*   No results for input(s): LIPASE, AMYLASE in the last 168 hours. Recent Labs  Lab 12/06/19 1823  AMMONIA 13   Coagulation Profile: No results for input(s): INR, PROTIME in the last 168 hours. Cardiac Enzymes: No results for input(s): CKTOTAL, CKMB, CKMBINDEX, TROPONINI in the last 168 hours. BNP (last 3 results) No results for input(s): PROBNP in the last 8760 hours. HbA1C: Recent Labs    12/05/19 1007  HGBA1C 6.3*   CBG: Recent Labs  Lab 12/05/19 0953  GLUCAP 122*   Lipid Profile: Recent Labs    12/06/19 0414  CHOL 131  HDL 40*  LDLCALC 54  TRIG 161*  CHOLHDL 3.3   Thyroid Function Tests: Recent Labs    12/05/19 1458  TSH 6.162*  FREET4 0.91   Anemia Panel: Recent Labs    12/05/19 1458  VITAMINB12 528   Urine analysis:    Component Value Date/Time   COLORURINE YELLOW 12/05/2019 1600   APPEARANCEUR CLEAR 12/05/2019 1600   LABSPEC 1.013 12/05/2019 1600   PHURINE 6.0 12/05/2019 1600   GLUCOSEU NEGATIVE 12/05/2019 1600   HGBUR NEGATIVE 12/05/2019 1600   BILIRUBINUR NEGATIVE 12/05/2019 1600   KETONESUR NEGATIVE 12/05/2019 1600   PROTEINUR NEGATIVE 12/05/2019 1600   UROBILINOGEN 0.2 03/02/2010 1143   NITRITE NEGATIVE 12/05/2019 1600   LEUKOCYTESUR TRACE (A) 12/05/2019 1600   Sepsis Labs: (procalcitonin:4,lacticidven:4)  ) Recent Results (from the past 240 hour(s))  SARS CORONAVIRUS 2 (TAT 6-24 HRS) Nasopharyngeal Nasopharyngeal Swab     Status: None   Collection Time: 12/05/19 10:07 AM   Specimen: Nasopharyngeal Swab  Result Value Ref Range Status   SARS Coronavirus 2 NEGATIVE  NEGATIVE Final    Comment: (NOTE) SARS-CoV-2 target nucleic acids are NOT DETECTED. The SARS-CoV-2 RNA is generally detectable in upper and lower respiratory specimens during the acute phase of infection. Negative results do not preclude SARS-CoV-2 infection, do not rule out co-infections with other pathogens, and should not be used as the sole basis for treatment or other patient management decisions. Negative results must be combined with clinical observations, patient history, and epidemiological information. The expected result is Negative. Fact Sheet for Patients: HairSlick.no Fact Sheet for Healthcare Providers: quierodirigir.com This test is not yet approved or cleared by the [place] FDA and  has been authorized for detection and/or diagnosis of SARS-CoV-2 by FDA under an Emergency Use Authorization (EUA). This EUA will remain  in effect (meaning this test can be used) for the  duration of the COVID-19 declaration under Section 56 4(b)(1) of the Act, 21 U.S.C. section 360bbb-3(b)(1), unless the authorization is terminated or revoked sooner. Performed at [facility], [address], [place], [place] [address]   Culture, Urine     Status: None (Preliminary result)   Collection Time: 12/06/19 11:29 AM   Specimen: Urine, Clean Catch  Result Value Ref Range Status   Specimen Description URINE, CLEAN CATCH  Final   Special Requests Normal  Final   Culture   Final    CULTURE REINCUBATED FOR BETTER GROWTH Performed at [facility], [address], [place], [place] [address]    Report Status PENDING  Incomplete      Studies: EEG  Result Date: 12/06/2019 Thana Farr, MD     12/06/2019  6:04 PM ELECTROENCEPHALOGRAM REPORT Patient: PRISHA HILEY       Room #: 6E04C EEG No. ID: [id number] Age: 85 y.o.        Sex: female Referring Physician: Margo Aye Report Date:  12/06/2019       Interpreting Physician:  Thana Farr History: AMORI COOPERMAN is an 85 y.o. female with altered mental status Medications: Rocephin, Plavix, Synthroid, Zocor Conditions of Recording:  This is a 21 channel routine scalp EEG performed with bipolar and monopolar montages arranged in accordance to the international 10/20 system of electrode placement. One channel was dedicated to EKG recording. The patient is in the awake and drowsy states. Description:  Artifact is prominent during the recording often obscuring the background rhythm. When able to be visualized the posterior background rhythm reached a maximum of 8 Hz alpha activity although for much of the recording it was actually slower with an underlying polymorphic delta activity noted that slowed the rhythm.  The patient drowses with further slowing noted. Also noted are intermittent periodic discharges of triphasic morphology. Hyperventilation and intermittent photic stimulation were not performed. IMPRESSION: This is an abnormal electroencephalogram secondary to background slowing with occasional triphasic waves consistent with an encephalopathy of nonspecific etiology.  Thana Farr, MD Neurology [phone] 12/06/2019, 5:47 PM   DG Swallowing Func-Speech Pathology  Result Date: 12/06/2019 Objective Swallowing Evaluation: Type of Study: MBS-Modified Barium Swallow Study  Patient Details Name: VERA FURNISS MRN: [id number] Date of Birth: 1932-09-03 Today's Date: 12/06/2019 Time: SLP Start Time (ACUTE ONLY): 1325 -SLP Stop Time (ACUTE ONLY): 1356 SLP Time Calculation (min) (ACUTE ONLY): 31 min Past Medical History: Past Medical History: Diagnosis Date . Allergy  . Hypertension  . Stroke (HCC)  . Thyroid disease  Past Surgical History: No past surgical history on file. HPI: DEJANEE THIBEAUX is a 85 y.o. female with medical history significant of stroke with residual right-sided weakness, aphasia, slurred speech, hypertension hyperlipidemia, thyroidism, presented with running  of her weakness for 7 days and change in mental status for 2 days.   Granddaughter reported choking event piror to admission.   Head CT on 12/18 reported "No acute intracranial abnormality evident by CT."  Waiting on MRI results.  CXR on 12/18 reported "Low lung volumes with streaky bibasilar opacities, likely atelectasis".  Subjective: Pt was awake/alert. Assessment / Plan / Recommendation CHL IP CLINICAL IMPRESSIONS 12/06/2019 Clinical Impression Pt presents with moderately-severe oral dysphagia and mild pharyngeal dysphagia with resultant deep laryngeal penetration of thin liquid, nectar-thick liquid, and honey-thick liquid on today's examination.  Laryngeal penetration was secondary to reduced laryngeal closure and delayed swallow iniation at the level of the pyriform sinuses.   Penetration did not  always clear the laryngeal vestibule.  Of note, visibility of the laryngeal vestibule was obstructed by the pt's shoulder in this examination; therefore unable to definitively state whether trace aspiration occurred; however, pt is at an elevated risk for aspiration secondary to penetration not clearing the laryngeal vestibule.  Oral phase was remarkable for bolus holding with all consistencies, benefited by frequent presentation of a dry spoon to cue pt for AP transport and swallow initiation.  Despite cues, pt with moderate amounts of residue in her oral cavity at the completion of this examination.  Oral residue increased with increased viscosity of the trials, with thin liquid clearing the best.  She additionally exhibited anterior labial spillage, reduced lingual control resulting in premature spillage to the pyriform sinuses, and reduced lingual strength resulting in oral residue.  Suspect that cognitive-linguistic deficits impacted the pt's oral phase of the swallow.  Pharyngeal phase was remarkable for mildly reduced BOT retraction and reduced hyolaryngeal elevation/excursion resulting in trace vallecular  residue.  Recommend NPO with frequent oral care and consideration of short-term alternative means of nutrition.  Pt may have a few small ice chips or small spoon sips of thin liquid following thorough oral care to mobilize oropharyngeal swallow musculature and to help keep oral cavity moistened.  She may also have essential meds crushed in puree with use of dry spoon, frequent cues for the pt to clear her oral cavity, and oral suction following administration. SLP Visit Diagnosis Dysphagia, oropharyngeal phase (R13.12) Attention and concentration deficit following -- Frontal lobe and executive function deficit following -- Impact on safety and function Moderate aspiration risk   CHL IP TREATMENT RECOMMENDATION 12/06/2019 Treatment Recommendations Therapy as outlined in treatment plan below   Prognosis 12/06/2019 Prognosis for Safe Diet Advancement Fair Barriers to Reach Goals Language deficits Barriers/Prognosis Comment -- CHL IP DIET RECOMMENDATION 12/06/2019 SLP Diet Recommendations NPO;Alternative means - temporary;Ice chips PRN after oral care Liquid Administration via Spoon Medication Administration Via alternative means Compensations Small sips/bites;Slow rate;Lingual sweep for clearance of pocketing;Multiple dry swallows after each bite/sip Postural Changes Seated upright at 90 degrees   CHL IP OTHER RECOMMENDATIONS 12/06/2019 Recommended Consults -- Oral Care Recommendations Oral care QID;Staff/trained caregiver to provide oral care Other Recommendations Have oral suction available   CHL IP FOLLOW UP RECOMMENDATIONS 12/06/2019 Follow up Recommendations Skilled Nursing facility;24 hour supervision/assistance   CHL IP FREQUENCY AND DURATION 12/06/2019 Speech Therapy Frequency (ACUTE ONLY) min 2x/week Treatment Duration 2 weeks      CHL IP ORAL PHASE 12/06/2019 Oral Phase Impaired Oral - Pudding Teaspoon -- Oral - Pudding Cup -- Oral - Honey Teaspoon Weak lingual manipulation;Incomplete tongue to palate  contact;Reduced posterior propulsion;Holding of bolus;Pocketing in anterior sulcus;Lingual/palatal residue;Piecemeal swallowing;Delayed oral transit;Decreased bolus cohesion;Premature spillage Oral - Honey Cup -- Oral - Nectar Teaspoon Weak lingual manipulation;Incomplete tongue to palate contact;Reduced posterior propulsion;Holding of bolus;Pocketing in anterior sulcus;Lingual/palatal residue;Piecemeal swallowing;Delayed oral transit;Decreased bolus cohesion;Premature spillage Oral - Nectar Cup -- Oral - Nectar Straw -- Oral - Thin Teaspoon Reduced posterior propulsion;Delayed oral transit;Premature spillage;Lingual/palatal residue Oral - Thin Cup -- Oral - Thin Straw Weak lingual manipulation;Incomplete tongue to palate contact;Reduced posterior propulsion;Holding of bolus;Pocketing in anterior sulcus;Lingual/palatal residue;Piecemeal swallowing;Delayed oral transit;Decreased bolus cohesion;Premature spillage Oral - Puree Weak lingual manipulation;Incomplete tongue to palate contact;Reduced posterior propulsion;Holding of bolus;Right pocketing in lateral sulci;Left pocketing in lateral sulci;Pocketing in anterior sulcus;Lingual/palatal residue;Piecemeal swallowing;Delayed oral transit;Decreased bolus cohesion;Premature spillage Oral - Mech Soft -- Oral - Regular -- Oral - Multi-Consistency -- Oral - Pill -- Oral  Phase - Comment --  CHL IP PHARYNGEAL PHASE 12/06/2019 Pharyngeal Phase Impaired Pharyngeal- Pudding Teaspoon -- Pharyngeal -- Pharyngeal- Pudding Cup -- Pharyngeal -- Pharyngeal- Honey Teaspoon Penetration/Aspiration before swallow;Reduced tongue base retraction;Reduced airway/laryngeal closure;Reduced laryngeal elevation;Reduced anterior laryngeal mobility;Delayed swallow initiation-pyriform sinuses Pharyngeal Material enters airway, remains ABOVE vocal cords and not ejected out Pharyngeal- Honey Cup -- Pharyngeal -- Pharyngeal- Nectar Teaspoon Delayed swallow initiation-pyriform sinuses;Reduced anterior  laryngeal mobility;Reduced laryngeal elevation;Reduced airway/laryngeal closure;Reduced tongue base retraction;Penetration/Aspiration before swallow;Pharyngeal residue - valleculae Pharyngeal Material enters airway, remains ABOVE vocal cords and not ejected out Pharyngeal- Nectar Cup -- Pharyngeal -- Pharyngeal- Nectar Straw -- Pharyngeal -- Pharyngeal- Thin Teaspoon Delayed swallow initiation-pyriform sinuses;Reduced anterior laryngeal mobility;Reduced laryngeal elevation;Reduced airway/laryngeal closure;Reduced tongue base retraction;Penetration/Aspiration before swallow Pharyngeal Material enters airway, remains ABOVE vocal cords and not ejected out Pharyngeal- Thin Cup -- Pharyngeal -- Pharyngeal- Thin Straw Delayed swallow initiation-pyriform sinuses;Reduced anterior laryngeal mobility;Reduced laryngeal elevation;Reduced airway/laryngeal closure;Reduced tongue base retraction;Penetration/Aspiration before swallow Pharyngeal Material enters airway, remains ABOVE vocal cords then ejected out Pharyngeal- Puree Delayed swallow initiation-pyriform sinuses;Reduced anterior laryngeal mobility;Reduced laryngeal elevation;Reduced tongue base retraction;Pharyngeal residue - valleculae Pharyngeal Material does not enter airway Pharyngeal- Mechanical Soft -- Pharyngeal -- Pharyngeal- Regular -- Pharyngeal -- Pharyngeal- Multi-consistency -- Pharyngeal -- Pharyngeal- Pill -- Pharyngeal -- Pharyngeal Comment --  No flowsheet data found. Colin Mulders., M.S., CCC-SLP Acute Rehabilitation Services Office: [phone] [place] 12/06/2019, 3:54 PM               Scheduled Meds: . chlorhexidine  15 mL Mouth Rinse BID  . clopidogrel  75 mg Oral Daily  . enalaprilat  0.625 mg Intravenous Q6H  . heparin  5,000 Units Subcutaneous Q12H  . levothyroxine  75 mcg Intravenous Daily  . lisinopril  20 mg Oral Daily  . mouth rinse  15 mL Mouth Rinse q12n4p  .  morphine injection  2 mg Intravenous Once  . polyethylene glycol  17  g Oral Daily  . senna-docusate  2 tablet Oral BID  . simvastatin  20 mg Oral QPM    Continuous Infusions: . cefTRIAXone (ROCEPHIN)  IV 1 g (12/07/19 1112)  . dextrose 5% lactated ringers       LOS: 2 days     Kayleen Memos, MD [facility] Pager [phone]  If 7PM-7AM, please contact night-coverage [email or web address] Password [email or web address] 12/07/2019, 12:20 PM

## 2019-12-07 NOTE — Consult Note (Signed)
[place] Nurse Consult Note: Patient receiving care in [place].  Consult completed remotely after review of record. Reason for Consult: b/l unna boots, LLE wound Wound type: unknown at this time.  H&P from 12/18 indicates there are wrappings to the BLE, nothing further. Pressure Injury POA: Yes/No/NA Measurement,Wound bed,Drainage (amount, consistency, odor), Periwound: To be provided by the bedside RN in the flowsheet section Dressing procedure/placement/frequency: Orders for bedside nurse to remove wrappings, wash legs, apply Xeroform gauze over any wounds, then call Ortho Tech for bilateral Unna boot placement has been entered. Monitor the wound area(s) for worsening of condition such as: Signs/symptoms of infection,  Increase in size,  Development of or worsening of odor, Development of pain, or increased pain at the affected locations.  Notify the medical team if any of these develop.  Thank you for the consult. [facility] nurse will not follow at this time.  Please re-consult the [place] team if needed.  Val Riles, RN, MSN, CWOCN, CNS-BC, pager [phone]

## 2019-12-07 NOTE — Plan of Care (Signed)
  Problem: Clinical Measurements: Goal: Respiratory complications will improve Outcome: Progressing Note: Stable on room air.  No s/s of respiratory complications noted. Goal: Cardiovascular complication will be avoided Outcome: Progressing Note: NSR on telemetry.  No s/s of cardiovascular complication noted.   

## 2019-12-08 ENCOUNTER — Inpatient Hospital Stay (HOSPITAL_COMMUNITY): Payer: Medicare Other

## 2019-12-08 DIAGNOSIS — I639 Cerebral infarction, unspecified: Secondary | ICD-10-CM

## 2019-12-08 DIAGNOSIS — I340 Nonrheumatic mitral (valve) insufficiency: Secondary | ICD-10-CM

## 2019-12-08 DIAGNOSIS — I361 Nonrheumatic tricuspid (valve) insufficiency: Secondary | ICD-10-CM

## 2019-12-08 LAB — CBC
HCT: 38.1 % (ref 36.0–46.0)
Hemoglobin: 12.4 g/dL (ref 12.0–15.0)
MCH: 32.5 pg (ref 26.0–34.0)
MCHC: 32.5 g/dL (ref 30.0–36.0)
MCV: 100 fL (ref 80.0–100.0)
Platelets: 242 10*3/uL (ref 150–400)
RBC: 3.81 MIL/uL — ABNORMAL LOW (ref 3.87–5.11)
RDW: 13.9 % (ref 11.5–15.5)
WBC: 5.7 10*3/uL (ref 4.0–10.5)
nRBC: 0 % (ref 0.0–0.2)

## 2019-12-08 LAB — URINE CULTURE
Culture: 30000 — AB
Special Requests: NORMAL

## 2019-12-08 LAB — BASIC METABOLIC PANEL
Anion gap: 10 (ref 5–15)
BUN: 11 mg/dL (ref 8–23)
CO2: 28 mmol/L (ref 22–32)
Calcium: 8.7 mg/dL — ABNORMAL LOW (ref 8.9–10.3)
Chloride: 107 mmol/L (ref 98–111)
Creatinine, Ser: 0.66 mg/dL (ref 0.44–1.00)
GFR calc Af Amer: >60 mL/min (ref >60)
GFR calc non Af Amer: >60 mL/min (ref >60)
Glucose, Bld: 131 mg/dL — ABNORMAL HIGH (ref 70–99)
Potassium: 3.2 mmol/L — ABNORMAL LOW (ref 3.5–5.1)
Sodium: 145 mmol/L (ref 135–145)

## 2019-12-08 LAB — MAGNESIUM: Magnesium: 2 mg/dL (ref 1.7–2.4)

## 2019-12-08 MED ORDER — SODIUM CHLORIDE 0.9 % IV SOLN
1.0000 g | Freq: Four times a day (QID) | INTRAVENOUS | Status: DC
  Administered 2019-12-08: 1 g via INTRAVENOUS
  Filled 2019-12-08 (×4): qty 1000

## 2019-12-08 MED ORDER — FOSFOMYCIN TROMETHAMINE 3 G PO PACK
3.0000 g | PACK | Freq: Once | ORAL | Status: DC
  Filled 2019-12-08: qty 3

## 2019-12-08 MED ORDER — DEXTROSE IN LACTATED RINGERS 5 % IV SOLN: INTRAVENOUS | Status: DC

## 2019-12-08 MED ORDER — AMPICILLIN 500 MG PO CAPS
500.0000 mg | ORAL_CAPSULE | Freq: Four times a day (QID) | ORAL | Status: DC
  Filled 2019-12-08: qty 1

## 2019-12-08 MED ORDER — SODIUM CHLORIDE 0.9 % IV SOLN
1.0000 g | Freq: Four times a day (QID) | INTRAVENOUS | Status: DC
  Administered 2019-12-08 – 2019-12-10 (×7): 1 g via INTRAVENOUS
  Filled 2019-12-08 (×9): qty 1000

## 2019-12-08 MED ORDER — HYDRALAZINE HCL 20 MG/ML IJ SOLN
5.0000 mg | Freq: Three times a day (TID) | INTRAMUSCULAR | Status: DC | PRN
  Administered 2019-12-08 – 2019-12-09 (×2): 5 mg via INTRAVENOUS
  Filled 2019-12-08 (×2): qty 1

## 2019-12-08 MED ORDER — POTASSIUM CHLORIDE 10 MEQ/100ML IV SOLN
10.0000 meq | INTRAVENOUS | Status: AC
  Administered 2019-12-08 (×4): 10 meq via INTRAVENOUS
  Filled 2019-12-08 (×4): qty 100

## 2019-12-08 NOTE — Progress Notes (Signed)
Received pt, MRI still not done.

## 2019-12-08 NOTE — Progress Notes (Signed)
Spoke with patient's husband [name] who gave consent for Cortrak tube placement.

## 2019-12-08 NOTE — Progress Notes (Signed)
  Speech Language Pathology Treatment: Dysphagia;Cognitive-Linquistic  Patient Details Name: Jillian Gay MRN: [id number] DOB: 19-Nov-1932 Today's Date: 12/08/2019 Time: 0920-0939 SLP Time Calculation (min) (ACUTE ONLY): 19 min  Assessment / Plan / Recommendation Clinical Impression  Yes/no responses yielded 20% accuracy with max multimodal cues provided; pt responded with name and head nod or "uh huh" with response to simple biographical questions; she followed simple 1-step directives such as "open your mouth" and "stick out your tongue" with intake of ice chips/tsp water during PO trials; weak throat clearing/open mouth posture noted prior to PO intake; she also exhibited multiple swallows and decreased mastication with ice chips and delayed oral propulsion/manipulation and delayed throat clearing with 1/3 tsp thin/ice chips.  Recommend continue NPO status and may consider non-oral nutrition for hydration/nutritional needs; ST will continue to f/u for PO readiness/communcation while in acute setting.    HPI HPI: Jillian Gay is a 85 y.o. female with medical history significant of stroke with residual right-sided weakness, aphasia, slurred speech, hypertension hyperlipidemia, thyroidism, presented with running of her weakness for 7 days and change in mental status for 2 days.   Granddaughter reported choking event piror to admission.   Head CT on 12/18 reported "No acute intracranial abnormality evident by CT."  Waiting on MRI results.  CXR on 12/18 reported "Low lung volumes with streaky bibasilar opacities, likely atelectasis".       SLP Plan  Continue with current plan of care       Recommendations  Diet recommendations: NPO Medication Administration: Via alternative means                Oral Care Recommendations: Oral care QID;Staff/trained caregiver to provide oral care Follow up Recommendations: Skilled Nursing facility;24 hour supervision/assistance SLP Visit Diagnosis:  Dysphagia, oropharyngeal phase (R13.12) Plan: Continue with current plan of care                      Elvina Sidle, M.S., CCC-SLP 12/08/2019, 11:05 AM

## 2019-12-08 NOTE — Progress Notes (Signed)
Inpatient Rehabilitation-Admissions Coordinator   Met with pt and her husband bedside for rehabilitation assessment. Pt not vocalizing much and AC spent time gathering information from her husband regarding PLOF. Pt has not ambulated in over 7 years and is Max A/Total A for most ADLs. Pt typically requires Mod A for transfers of 1 -2 people at times and uses a transport chair to get from one place to another. Pt's husband and family have been providing great care from what it sounds like at home and prefers she get stronger in rehab prior to returning home. I will follow along to see how pt does in her next few sessions with therapy to see if she can participate in an intensive rehab program and if pt will be able to get achieve measurable goals in a relatively quick period of time. Pt's husband verbalizes understanding that this assessment is ongoing and will depend on how she participates with therapy in the next day or two.   Please call if questions.   Raechel Ache, OTR/L  Rehab Admissions Coordinator  [phone] 12/08/2019 5:26 PM

## 2019-12-08 NOTE — Progress Notes (Addendum)
PROGRESS NOTE  Jillian Gay IZT:[id number] DOB: 1932/06/24 DOA: 12/05/2019 PCP: Burnard Bunting, MD  HPI/Recap of past 73 hours: 85 year old female with past medical history significant for CVA with residual right-sided weakness, slurred speech, hypertension, hyperlipidemia, hypothyroidism, who presented with change in mental status, unable to recognize family members since yesterday.  Concern for new CVA, CT head done negative for any acute intracranial findings, showed old CVA.  MRI brain ordered.  Admitted for AMS/CVA work-up.  MRI brain completed 12/21 and showed acute/subacute right ACA stroke.  She had EEG slowing with occasional triphasic waves.  12/08/19: Seen and examined.  Nonverbal and minimally interactive.  MRI brain showed acute/subacute right ACA stroke.  Ongoing stroke work-up.  Assessed by speech therapist with recommendation for n.p.o.  Cortrak team consulted for possible cortrak tube placement today.  Consent obtained from patient's spouse.    Assessment/Plan: Active Problems:   Altered mental state   AMS (altered mental status)  Acute/subacute right ACA CVA in the setting of prior CVA. Seen on MRI brain done on 12/08/2019. LDL 54, goal less than 70 on simvastatin 20 mg daily A1c 6.3 with goal less than 7.0 PT OT assessment recommended CIR Speech therapist recommended n.p.o. Cortrak placement today for nutrition and medication administration 2D echo with bubble study and bilateral duplex ultrasound ordered and pending Continue to monitor on telemetry Reviewed twelve-lead EKG done on 12/05/2019, A. fib rate of 68 Neurology/stroke team following.  New Afib? Obtain twelve-lead EKG Continue to monitor on telemetry 2D echo ordered and pending  Acute metabolic encephalopathy, likely secondary to acute/subacute right ACA CVA. CT head showed old CVA with no acute intracranial findings Unable to complete MRI brain on 12/06/2019 due to back pain. Reattempt MRI  today with findings as above. Elevated TSH 6, free T4 normal Continue home levothyroxine, switched to IV due to NPO  T12 compression fracture Seen on MRI spine. Pain management in place.  Enterococcus faecalis UTI WBC UA 11-20>> UCX grew 30,000 Enterococcus faecalis, pansensitive. Unable to obtain history from the patient due to altered mental status. Treating with ampicillin x3 days.  History of CVA with right sided weakness Continue PT OT with assistance and fall precautions Recommended CIR placement.  Dysphagia N.p.o. Speech therapist following While n.p.o. start LR D5w at 50 cc/h Switch oral medications to IV Possible Cortrak placement today 12/21.  Subclinical hypothyroidism TSH elevated 6 Free T4 normal Continue levothyroxine, switched to IV due to NPO  Essential hypertension Ongoing permissive hypertension then gradually normalize Vasotec IV TID Continue to monitor vital signs  Hypokalemia Potassium 3.2 Repleted Repeat BMP in the morning  DVT prophylaxis: Heparin subcu BID  Code Status: DNR  Family Communication:  Updated husband and granddaughter on 12/08/19.     Disposition Plan:  Unknown at this time due to ongoing work-up.  Possible admission to CIR for rehab.   Objective: Vitals:   12/08/19 0515 12/08/19 0738 12/08/19 1100 12/08/19 1106  BP:  (!) 174/70 (!) 177/69   Pulse: (!) 55 (!) 49 85   Resp: 14 12 16    Temp:    98.6 F (37 C)  TempSrc:    Oral  SpO2: 96% 100% 93%   Weight:      Height:        Intake/Output Summary (Last 24 hours) at 12/08/2019 1307 Last data filed at 12/08/2019 1108 Gross per 24 hour  Intake 956.08 ml  Output 1350 ml  Net -393.92 ml   Filed Weights   12/06/19 2014  12/07/19 0502 12/08/19 0423  Weight: 91.2 kg 92.3 kg 92.4 kg    Exam:  . General: 85 y.o. year-old female well-developed well-nourished no acute distress.  Minimally interactive.  Aphasic. . Cardiovascular: Regular rate and rhythm no rubs or  gallops noted radial thyromegaly noted. Marland Kitchen Respiratory: Clear to auscultation no wheezes or rales.  Poor inspiratory effort. . Abdomen: Soft nontender normal bowel sounds present.   . Musculoskeletal: Compression stockings in place bilaterally.   Marland Kitchen Psychiatry: Mood is appropriate for condition and setting..  Data Reviewed: CBC: Recent Labs  Lab 12/05/19 1007 12/05/19 1017 12/08/19 0322  WBC 6.3  --  5.7  NEUTROABS 4.6  --   --   HGB 12.2 11.9* 12.4  HCT 38.4 35.0* 38.1  MCV 103.2*  --  100.0  PLT 214  --  242   Basic Metabolic Panel: Recent Labs  Lab 12/05/19 1007 12/05/19 1017 12/07/19 0342 12/08/19 0322  NA 146* 144 144 145  K 3.6 3.8 3.6 3.2*  CL 113*  --  107 107  CO2 24  --  27 28  GLUCOSE 128*  --  122* 131*  BUN 16  --  12 11  CREATININE 1.03*  --  0.81 0.66  CALCIUM 8.3*  --  8.7* 8.7*  MG  --   --  2.1 2.0   GFR: Estimated Creatinine Clearance: 57.8 mL/min (by C-G formula based on SCr of 0.66 mg/dL). Liver Function Tests: Recent Labs  Lab 12/05/19 1007  AST 26  ALT 23  ALKPHOS 44  BILITOT 0.8  PROT 5.1*  ALBUMIN 2.8*   No results for input(s): LIPASE, AMYLASE in the last 168 hours. Recent Labs  Lab 12/06/19 1823  AMMONIA 13   Coagulation Profile: No results for input(s): INR, PROTIME in the last 168 hours. Cardiac Enzymes: No results for input(s): CKTOTAL, CKMB, CKMBINDEX, TROPONINI in the last 168 hours. BNP (last 3 results) No results for input(s): PROBNP in the last 8760 hours. HbA1C: No results for input(s): HGBA1C in the last 72 hours. CBG: Recent Labs  Lab 12/05/19 0953  GLUCAP 122*   Lipid Profile: Recent Labs    12/06/19 0414  CHOL 131  HDL 40*  LDLCALC 54  TRIG 409*  CHOLHDL 3.3   Thyroid Function Tests: Recent Labs    12/05/19 1458  TSH 6.162*  FREET4 0.91   Anemia Panel: Recent Labs    12/05/19 1458  VITAMINB12 528   Urine analysis:    Component Value Date/Time   COLORURINE YELLOW 12/05/2019 1600    APPEARANCEUR CLEAR 12/05/2019 1600   LABSPEC 1.013 12/05/2019 1600   PHURINE 6.0 12/05/2019 1600   GLUCOSEU NEGATIVE 12/05/2019 1600   HGBUR NEGATIVE 12/05/2019 1600   BILIRUBINUR NEGATIVE 12/05/2019 1600   KETONESUR NEGATIVE 12/05/2019 1600   PROTEINUR NEGATIVE 12/05/2019 1600   UROBILINOGEN 0.2 03/02/2010 1143   NITRITE NEGATIVE 12/05/2019 1600   LEUKOCYTESUR TRACE (A) 12/05/2019 1600   Sepsis Labs: (procalcitonin:4,lacticidven:4)  ) Recent Results (from the past 240 hour(s))  SARS CORONAVIRUS 2 (TAT 6-24 HRS) Nasopharyngeal Nasopharyngeal Swab     Status: None   Collection Time: 12/05/19 10:07 AM   Specimen: Nasopharyngeal Swab  Result Value Ref Range Status   SARS Coronavirus 2 NEGATIVE NEGATIVE Final    Comment: (NOTE) SARS-CoV-2 target nucleic acids are NOT DETECTED. The SARS-CoV-2 RNA is generally detectable in upper and lower respiratory specimens during the acute phase of infection. Negative results do not preclude SARS-CoV-2 infection, do not rule out  co-infections with other pathogens, and should not be used as the sole basis for treatment or other patient management decisions. Negative results must be combined with clinical observations, patient history, and epidemiological information. The expected result is Negative. Fact Sheet for Patients: HairSlick.no Fact Sheet for Healthcare Providers: quierodirigir.com This test is not yet approved or cleared by the [place] FDA and  has been authorized for detection and/or diagnosis of SARS-CoV-2 by FDA under an Emergency Use Authorization (EUA). This EUA will remain  in effect (meaning this test can be used) for the duration of the COVID-19 declaration under Section 56 4(b)(1) of the Act, 21 U.S.C. section 360bbb-3(b)(1), unless the authorization is terminated or revoked sooner. Performed at [facility], [address], [place],  [place] [address]   Culture, Urine     Status: Abnormal   Collection Time: 12/06/19 11:29 AM   Specimen: Urine, Clean Catch  Result Value Ref Range Status   Specimen Description URINE, CLEAN CATCH  Final   Special Requests   Final    Normal Performed at [facility], [address], [place], [place] [address]    Culture 30,000 COLONIES/mL ENTEROCOCCUS FAECALIS (A)  Final   Report Status 12/08/2019 FINAL  Final   Organism ID, Bacteria ENTEROCOCCUS FAECALIS (A)  Final      Susceptibility   Enterococcus faecalis - MIC*    AMPICILLIN <=2 SENSITIVE Sensitive     NITROFURANTOIN <=16 SENSITIVE Sensitive     VANCOMYCIN 1 SENSITIVE Sensitive     * 30,000 COLONIES/mL ENTEROCOCCUS FAECALIS      Studies: MR BRAIN WO CONTRAST  Result Date: 12/08/2019 CLINICAL DATA:  Altered mental status. History of stroke. EXAM: MRI HEAD WITHOUT CONTRAST TECHNIQUE: Multiplanar, multiecho pulse sequences of the brain and surrounding structures were obtained without intravenous contrast. COMPARISON:  Head CT 12/05/2019 and MRI 02/14/2010 FINDINGS: Brain: There is an acute moderate-sized right ACA territory infarct involving the right paramedian frontal lobe, cingulate gyrus, and corpus callosum with a small amount of petechial hemorrhage. There is also a right ACA infarct more inferiorly in the right frontal lobe which is subacute in appearance with more confluent petechial hemorrhage. 2 adjacent punctate foci of mild hyperintensity on axial trace diffusion imaging in right temporo-occipital white matter are without corresponding reduced ADC. A chronic right parietal infarct is unchanged from the prior MRI. A moderately large right cerebellar infarct was acute on the prior MRI. Chronic left cerebellar infarcts have increased in number, and chronic infarcts in the left greater than right basal ganglia and thalami have also increased in number. There are also new chronic infarcts in the right corona radiata and pons. There  are numerous chronic microhemorrhages in the cerebellum, brainstem, basal ganglia, thalami, and both cerebral hemispheres which have increased in number from the prior MRI and are likely related to hypertension. Confluent T2 hyperintensities in the cerebral white matter bilaterally are nonspecific but compatible with severe chronic small vessel ischemic disease, progressed from the prior MRI. There is advanced cerebral and cerebellar atrophy. No mass, midline shift, or extra-axial fluid collection is identified. Vascular: Major intracranial vascular flow voids are preserved. Skull and upper cervical spine: No suspicious marrow lesion. Interbody ankylosis at C3-4 and C4-5. Sinuses/Orbits: Bilateral cataract extraction. Small left maxillary sinus mucous retention cyst. Trace left mastoid air cell fluid. Other: None. IMPRESSION: 1. Moderate-sized acute and subacute right ACA infarcts. 2. Progressive severe chronic small vessel ischemic disease with numerous chronic infarcts and microhemorrhages as above. Electronically  Signed   By: Sebastian Ache M.D.   On: 12/08/2019 08:55   MR LUMBAR SPINE WO CONTRAST  Result Date: 12/08/2019 CLINICAL DATA:  Back pain. EXAM: MRI LUMBAR SPINE WITHOUT CONTRAST TECHNIQUE: Multiplanar, multisequence MR imaging of the lumbar spine was performed. No intravenous contrast was administered. COMPARISON:  Lumbar spine radiographs 11/10/2019. CT abdomen and pelvis 09/25/2017. FINDINGS: Segmentation:  Standard. Alignment: Mild-to-moderate lumbar dextroscoliosis. 3 mm retrolisthesis of L1 on L2. Vertebrae: The T12 compression fracture questioned on the prior radiographs is confirmed and demonstrates approximately 50% vertebral body height loss which has progressed in the interim. There is marrow edema throughout the vertebral body, and there is a fluid-filled fracture cleft in the anteroinferior vertebral body. There is no retropulsion. There is mild marrow edema posteriorly along the L1  superior endplate with a shallow Schmorl's node type deformity. There is heterogeneous fatty bone marrow signal diffusely without a suspicious focal marrow lesion identified. Multilevel chronic degenerative endplate marrow changes are present. Conus medullaris and cauda equina: Conus extends to the upper L2 level. Conus and cauda equina appear normal. Paraspinal and other soft tissues: Chronic moderate left hydronephrosis with a stone at the ureteropelvic junction as demonstrated on the 2018 CT. Bilateral renal cysts. Moderate paravertebral soft tissue edema from T11-L1 associated with the T12 fracture. Disc levels: Severe disc space narrowing greater on the left at L1-2 and L2-3 and greater on the right at L4-5 and L5-S1. T11-12: Minimal disc bulging, ligamentum flavum hypertrophy, and moderate facet arthrosis result in mild spinal stenosis and mild right neural foraminal stenosis. T12-L1: Mild disc bulging and moderate facet arthrosis without significant stenosis. L1-2: Retrolisthesis with bulging uncovered disc and mild facet and ligamentum flavum hypertrophy result in minimal left lateral recess stenosis without spinal or neural foraminal stenosis. L2-3: Mild disc bulging and endplate spurring eccentric to the left and mild facet and ligamentum flavum hypertrophy result in mild left neural foraminal stenosis without spinal stenosis. L3-4: Mild disc bulging eccentric to the left and mild facet and ligamentum flavum hypertrophy result in mild spinal stenosis and mild left neural foraminal stenosis. L4-5: Circumferential disc bulging and moderate right and mild left facet and ligamentum flavum hypertrophy result in mild right neural foraminal stenosis without significant spinal stenosis. L5-S1: Prior right laminectomy. Disc bulging, endplate spurring, severe disc space height loss, and mild facet hypertrophy result in mild right and moderate left neural foraminal stenosis without spinal stenosis. IMPRESSION: 1.  Subacute T12 compression fracture with 50% height loss, increased from prior radiographs. 2. Diffuse disc and facet degeneration with mild spinal stenosis at T11-12 and L3-4. 3. Multilevel neural foraminal stenosis, moderate on the left at L5-S1. 4. 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 Electronically Signed   By: Sebastian Ache M.D.   On: 12/08/2019 09:47    Scheduled Meds: . chlorhexidine  15 mL Mouth Rinse BID  . clopidogrel  75 mg Oral Daily  . enalaprilat  0.625 mg Intravenous Q6H  . heparin  5,000 Units Subcutaneous Q12H  . levothyroxine  75 mcg Intravenous Daily  . lisinopril  20 mg Oral Daily  . mouth rinse  15 mL Mouth Rinse q12n4p  . polyethylene glycol  17 g Oral Daily  . senna-docusate  2 tablet Oral BID  . simvastatin  20 mg Oral QPM    Continuous Infusions: . ampicillin (OMNIPEN) IV    . dextrose 5% lactated ringers 50 mL/hr at 12/08/19 0730     LOS: 3 days  Darlin Droparole N Anthony Roland, MD [facility] Pager [phone]  If 7PM-7AM, please contact night-coverage [email or web address] Password [email or web address] 12/08/2019, 1:07 PM

## 2019-12-08 NOTE — Procedures (Signed)
Cortrak  Person Inserting Tube:  Kerrilyn Azbill, RD Tube Type:  Cortrak - 43 inches Tube Location:  Right nare Initial Placement:  Stomach Secured by: Bridle Technique Used to Measure Tube Placement:  Documented cm marking at nare/ corner of mouth Cortrak Secured At:  75 cm   No x-ray is required. RN may begin using tube.   If the tube becomes dislodged please keep the tube and contact the Cortrak team at [email or web address] (password [email or web address]) for replacement.  If after hours and replacement cannot be delayed, place a NG tube and confirm placement with an abdominal x-ray.    Elizebeth Kluesner RD, LDN Clinical Nutrition Pager # - [phone]    

## 2019-12-08 NOTE — Progress Notes (Signed)
Updated patient's granddaughter Marylin Crosby and spouse via phone.  All questions answered to their satisfaction.

## 2019-12-08 NOTE — Progress Notes (Addendum)
Neurology Progress Note   S://  No new neuro events overnight.  Patient is lying in bed in NAD.  Spoke with RN at bedside.  MRI Brain Acute/subacute Right ACA infarct. Vitals HR 58, 165/66, RR 24, 97% on RA   O:// Current vital signs: BP (!) 177/69   Pulse 85   Temp 98.6 F (37 C) (Oral)   Resp 16   Ht 5\' 5"  (1.651 m) Comment: per chart   Wt 92.4 kg   SpO2 93%   BMI 33.88 kg/m  Vital signs in last 24 hours: Temp:  [98.3 F (36.8 C)-98.9 F (37.2 C)] 98.6 F (37 C) (12/21 1106) Pulse Rate:  [49-85] 85 (12/21 1100) Resp:  [12-16] 16 (12/21 1100) BP: (170-181)/(62-72) 177/69 (12/21 1100) SpO2:  [93 %-100 %] 93 % (12/21 1100) Weight:  [92.4 kg] 92.4 kg (12/21 0423)   GENERAL: Awake, alert in NAD HENT: - Normocephalic and atraumatic, dry mm, Eyes: normal conjunctiva LUNGS - Clear/diminished to auscultation bilaterally with no wheezes CV - S1S2 RRR, no m/r/g, equal pulses bilaterally. ABDOMEN - Soft, nontender, nondistended with normoactive BS Ext: warm, well perfused, intact peripheral pulses, no  Edema, Bilateral lower extremities wrapped in ace wrap PSYCH: calm   NEURO:  Mental Status: Awake, eyes open. Patient is nonverbal and does not follow commands. No attempts at speaking. Per RN patient said "good morning" once. Language: speech is globally aphasic. Cranial Nerves: PERRL 63mm/sluggish. EOMI (tracks), visual fields full to threat,right facial asymmetry, facial sensation intact, hearing intact, tongue/uvula/soft palate midline, normal sternocleidomastoid and trapezius muscle strength. No evidence of tongue atrophy or fibrillations Motor: Right upper extremity able to hold up but drifts to bed.  Left upper holds up with slight drift.  Right hand is contracted.  Did not move bilateral legs when told but did to noxious stimuli Tone: is normal and bulk is normal Sensation- Intact to noxious stimuli Coordination: unable to test Gait- deferred   1a Level of Conscious.:  alert 0 1b LOC Questions: none correct 2 1c LOC Commands: performs neither correctly 2 2 Best Gaze: normal 0 3 Visual: no visual loss 0 4 Facial Palsy: minor 1 5a Motor Arm - left: drift 2 5b Motor Arm - Right: some effort against gravity 3 6a Motor Leg - Left: no effort against gravity 3 6b Motor Leg - Right: no effort against gravity 3 7 Limb Ataxia: absent 0 8 Sensory: normal 0 9 Best Language: global 3 10 Dysarthria: severe 2 11 Extinct. and Inatten.: no abnormality 0 TOTAL: 21 NIHSS 21  Medications  Current Facility-Administered Medications:  .  acetaminophen (TYLENOL) tablet 650 mg, 650 mg, Oral, Q4H PRN **OR** acetaminophen (TYLENOL) 160 MG/5ML solution 650 mg, 650 mg, Per Tube, Q4H PRN **OR** acetaminophen (TYLENOL) suppository 650 mg, 650 mg, Rectal, Q4H PRN, Wynetta Fines T, MD .  ampicillin (OMNIPEN) 1 g in sodium chloride 0.9 % 100 mL IVPB, 1 g, Intravenous, Q6H, Hall, Carole N, DO .  bisacodyl (DULCOLAX) suppository 10 mg, 10 mg, Rectal, Daily PRN, Hall, Carole N, DO .  chlorhexidine (PERIDEX) 0.12 % solution 15 mL, 15 mL, Mouth Rinse, BID, Hall, Carole N, DO, 15 mL at 12/08/19 0938 .  clopidogrel (PLAVIX) tablet 75 mg, 75 mg, Oral, Daily, Wynetta Fines T, MD, 75 mg at 12/06/19 1052 .  dextrose 5 % in lactated ringers infusion, , Intravenous, Continuous, Kayleen Memos, DO, Last Rate: 50 mL/hr at 12/08/19 0730, New Bag at 12/08/19 0730 .  enalaprilat (VASOTEC) injection 0.625  mg, 0.625 mg, Intravenous, Q6H, Hall, Carole N, DO, 0.625 mg at 12/08/19 0521 .  heparin injection 5,000 Units, 5,000 Units, Subcutaneous, Q12H, Mikey College T, MD, 5,000 Units at 12/08/19 1106 .  hydrALAZINE (APRESOLINE) injection 5 mg, 5 mg, Intravenous, Q8H PRN, Dow Adolph N, DO, 5 mg at 12/08/19 0941 .  levothyroxine (SYNTHROID, LEVOTHROID) injection 75 mcg, 75 mcg, Intravenous, Daily, Darlin Drop, DO, 75 mcg at 12/08/19 1107 .  lisinopril (ZESTRIL) tablet 20 mg, 20 mg, Oral, Daily, Mikey College T,  MD, 20 mg at 12/06/19 1052 .  LORazepam (ATIVAN) injection 1 mg, 1 mg, Intravenous, Q4H PRN, Hall, Carole N, DO .  MEDLINE mouth rinse, 15 mL, Mouth Rinse, q12n4p, Hall, Carole N, DO, 15 mL at 12/07/19 1706 .  polyethylene glycol (MIRALAX / GLYCOLAX) packet 17 g, 17 g, Oral, Daily, Hall, Carole N, DO .  senna-docusate (Senokot-S) tablet 2 tablet, 2 tablet, Oral, BID, Dow Adolph N, DO .  simvastatin (ZOCOR) tablet 20 mg, 20 mg, Oral, QPM, Emeline General, MD Labs CBC    Component Value Date/Time   WBC 5.7 12/08/2019 0322   RBC 3.81 (L) 12/08/2019 0322   HGB 12.4 12/08/2019 0322   HCT 38.1 12/08/2019 0322   PLT 242 12/08/2019 0322   MCV 100.0 12/08/2019 0322   MCH 32.5 12/08/2019 0322   MCHC 32.5 12/08/2019 0322   RDW 13.9 12/08/2019 0322   LYMPHSABS 1.0 12/05/2019 1007   MONOABS 0.6 12/05/2019 1007   EOSABS 0.1 12/05/2019 1007   BASOSABS 0.0 12/05/2019 1007    CMP     Component Value Date/Time   NA 145 12/08/2019 0322   K 3.2 (L) 12/08/2019 0322   CL 107 12/08/2019 0322   CO2 28 12/08/2019 0322   GLUCOSE 131 (H) 12/08/2019 0322   BUN 11 12/08/2019 0322   CREATININE 0.66 12/08/2019 0322   CALCIUM 8.7 (L) 12/08/2019 0322   PROT 5.1 (L) 12/05/2019 1007   ALBUMIN 2.8 (L) 12/05/2019 1007   AST 26 12/05/2019 1007   ALT 23 12/05/2019 1007   ALKPHOS 44 12/05/2019 1007   BILITOT 0.8 12/05/2019 1007   GFRNONAA >60 12/08/2019 0322   GFRAA >60 12/08/2019 0322    glycosylated hemoglobin  Lipid Panel     Component Value Date/Time   CHOL 131 12/06/2019 0414   TRIG 186 (H) 12/06/2019 0414   HDL 40 (L) 12/06/2019 0414   CHOLHDL 3.3 12/06/2019 0414   VLDL 37 12/06/2019 0414   LDLCALC 54 12/06/2019 0414     Imaging I have reviewed images in epic and the results pertinent to this consultation are:  CT-scan of the brain  12/18 no acute abnormality  MRI examination of the brain 12/21 acute/subacute Right ACA stroke  EEG slowing with occasional triphasic waves     Assessment:  This is an 85 yo female with past medical history of HTN, HLD, hypothyroidism and prior stroke with deficits.  Per chart review at baseline not able to ambulate and requires 1 person assist with transfers, usually oriented with slurred speech.  Per chart, she has progressively became weaker, developed confusion.    Impression:  Acute/subacute Right ACA infarct  AMS 2/2 UTI, being treated with ABX  Recommendations: - HgbA1c=6.4 -  fasting lipid panel LDL= 54,  Continue simvastatin 20mg  daily - Frequent neuro checks - Echocardiogram pending  - Carotid dopplers pending  -Currently on plavix 75mg  daily - Risk factor modification - Telemetry monitoring - PT consult, OT consult, Speech consult -  Stroke team to follow  Gevena Martenise Wolfe, DNP, ACNP  I have seen the patient and reviewed the above note.  Though the patient does not have the typical location for aphasia, given her history of multiple strokes, I wonder if this represents an amotivational syndrome or simply encephalopathy due to lack of reserve to compensate for the new stroke due to previous injury.   Work-up as above.  Ritta SlotMcNeill Laporscha Linehan, MD [facility] [phone]  If 7pm- 7am, please page neurology on call as listed in AMION.

## 2019-12-08 NOTE — Progress Notes (Signed)
Carotid Duplex has been completed.   Preliminary results in CV Proc.   Abram Sander 12/08/2019 3:44 PM

## 2019-12-09 ENCOUNTER — Inpatient Hospital Stay (HOSPITAL_COMMUNITY): Payer: Medicare Other

## 2019-12-09 DIAGNOSIS — I639 Cerebral infarction, unspecified: Secondary | ICD-10-CM

## 2019-12-09 DIAGNOSIS — I6330 Cerebral infarction due to thrombosis of unspecified cerebral artery: Secondary | ICD-10-CM

## 2019-12-09 LAB — HEMOGLOBIN A1C
Hgb A1c MFr Bld: 5.8 % — ABNORMAL HIGH (ref 4.8–5.6)
Mean Plasma Glucose: 119.76 mg/dL

## 2019-12-09 LAB — BASIC METABOLIC PANEL
Anion gap: 10 (ref 5–15)
BUN: 11 mg/dL (ref 8–23)
CO2: 24 mmol/L (ref 22–32)
Calcium: 8.7 mg/dL — ABNORMAL LOW (ref 8.9–10.3)
Chloride: 106 mmol/L (ref 98–111)
Creatinine, Ser: 0.76 mg/dL (ref 0.44–1.00)
GFR calc Af Amer: >60 mL/min (ref >60)
GFR calc non Af Amer: >60 mL/min (ref >60)
Glucose, Bld: 131 mg/dL — ABNORMAL HIGH (ref 70–99)
Potassium: 3.3 mmol/L — ABNORMAL LOW (ref 3.5–5.1)
Sodium: 140 mmol/L (ref 135–145)

## 2019-12-09 LAB — CBC
HCT: 36.2 % (ref 36.0–46.0)
Hemoglobin: 11.9 g/dL — ABNORMAL LOW (ref 12.0–15.0)
MCH: 32.6 pg (ref 26.0–34.0)
MCHC: 32.9 g/dL (ref 30.0–36.0)
MCV: 99.2 fL (ref 80.0–100.0)
Platelets: 251 10*3/uL (ref 150–400)
RBC: 3.65 MIL/uL — ABNORMAL LOW (ref 3.87–5.11)
RDW: 14.2 % (ref 11.5–15.5)
WBC: 5.2 10*3/uL (ref 4.0–10.5)
nRBC: 0 % (ref 0.0–0.2)

## 2019-12-09 LAB — GLUCOSE, CAPILLARY
Glucose-Capillary: 112 mg/dL — ABNORMAL HIGH (ref 70–99)
Glucose-Capillary: 100 mg/dL — ABNORMAL HIGH (ref 70–99)
Glucose-Capillary: 100 mg/dL — ABNORMAL HIGH (ref 70–99)
Glucose-Capillary: 114 mg/dL — ABNORMAL HIGH (ref 70–99)

## 2019-12-09 MED ORDER — SIMVASTATIN 20 MG PO TABS
20.0000 mg | ORAL_TABLET | Freq: Every evening | ORAL | Status: DC
  Administered 2019-12-10 – 2019-12-14 (×5): 20 mg
  Filled 2019-12-09 (×5): qty 1

## 2019-12-09 MED ORDER — ASPIRIN EC 81 MG PO TBEC: 81.0000 mg | DELAYED_RELEASE_TABLET | Freq: Every day | ORAL | Status: DC

## 2019-12-09 MED ORDER — ACETAMINOPHEN 650 MG RE SUPP: 650.0000 mg | RECTAL | Status: DC | PRN

## 2019-12-09 MED ORDER — ACETAMINOPHEN 325 MG PO TABS
650.0000 mg | ORAL_TABLET | ORAL | Status: DC | PRN
  Administered 2019-12-14: 650 mg
  Filled 2019-12-09: qty 2

## 2019-12-09 MED ORDER — LISINOPRIL 20 MG PO TABS
20.0000 mg | ORAL_TABLET | Freq: Every day | ORAL | Status: DC
  Administered 2019-12-10 – 2019-12-11 (×2): 20 mg
  Filled 2019-12-09 (×2): qty 1

## 2019-12-09 MED ORDER — SENNOSIDES-DOCUSATE SODIUM 8.6-50 MG PO TABS
2.0000 | ORAL_TABLET | Freq: Two times a day (BID) | ORAL | Status: DC
  Administered 2019-12-09 – 2019-12-15 (×9): 2
  Filled 2019-12-09 (×9): qty 2

## 2019-12-09 MED ORDER — ACETAMINOPHEN 160 MG/5ML PO SOLN
650.0000 mg | ORAL | Status: DC | PRN
  Administered 2019-12-12 – 2019-12-15 (×2): 650 mg
  Filled 2019-12-09 (×2): qty 20.3

## 2019-12-09 MED ORDER — MORPHINE SULFATE (PF) 2 MG/ML IV SOLN
1.0000 mg | INTRAVENOUS | Status: DC | PRN
  Administered 2019-12-09: 1 mg via INTRAVENOUS
  Filled 2019-12-09: qty 1

## 2019-12-09 MED ORDER — JEVITY 1.2 CAL PO LIQD
1000.0000 mL | ORAL | Status: DC
  Administered 2019-12-09 – 2019-12-15 (×6): 1000 mL
  Filled 2019-12-09 (×9): qty 1000

## 2019-12-09 MED ORDER — ASPIRIN 325 MG PO TABS
325.0000 mg | ORAL_TABLET | Freq: Every day | ORAL | Status: DC
  Administered 2019-12-09 – 2019-12-10 (×2): 325 mg via ORAL
  Filled 2019-12-09 (×2): qty 1

## 2019-12-09 MED ORDER — POLYETHYLENE GLYCOL 3350 17 G PO PACK
17.0000 g | PACK | Freq: Every day | ORAL | Status: DC
  Administered 2019-12-09 – 2019-12-14 (×4): 17 g
  Filled 2019-12-09 (×4): qty 1

## 2019-12-09 MED ORDER — CLOPIDOGREL BISULFATE 75 MG PO TABS
75.0000 mg | ORAL_TABLET | Freq: Every day | ORAL | Status: DC
  Administered 2019-12-10 – 2019-12-15 (×6): 75 mg
  Filled 2019-12-09 (×6): qty 1

## 2019-12-09 MED ORDER — POTASSIUM CHLORIDE 20 MEQ/15ML (10%) PO SOLN
40.0000 meq | Freq: Once | ORAL | Status: AC
  Administered 2019-12-09: 40 meq
  Filled 2019-12-09: qty 30

## 2019-12-09 NOTE — Progress Notes (Signed)
Physical Therapy Treatment Patient Details Name: Jillian Gay MRN: [id number] DOB: 08-30-1932 Today's Date: 12/09/2019    History of Present Illness 85 yo female with onset of confusion and mental lethargy was admitted, has clear head CT, MRI shows acute and subacute R ACA infarcts.  Note acute metabolic encephalopathy, suspected UTI, now nonverbal and new non ambulatory status.  Covid (-).   PMHx:  stroke, thyroid disease, HTN, ambulatory with help,    PT Comments    Patient received in bed, lethargic and requiring vigorous stimulation to open eyes, even then lays with eyes open and stares with eyes biased to right, non verbal and not following verbal or tactile commands whatsoever. Required totalAx2 (bordering on +3 assist) for all mobility today with no initiation or effort noted by patient, Max cues and hand over hand assist to hold on and let go of crossbar of steady. Initially required MaxA to maintain upright at EOB, faded to min guard, then lost balance again with no initiation or effort to correct. Heavy totalA x2 to barely clear buttocks from bed so that NT could remove soiled chucks, made second attempt but unable even from elevated surface. She was positioned to comfort with totalAx2 and left with all needs met, bed alarm active this afternoon. Updated PT recommendations as appropriate.      Follow Up Recommendations  SNF;Supervision/Assistance - 24 hour     Equipment Recommendations  Hospital bed;Wheelchair (measurements PT);Wheelchair cushion (measurements PT);Other (comment)(hoyer lift)    Recommendations for Other Services       Precautions / Restrictions Precautions Precautions: Fall Precaution Comments: monitor HR, O2 sat and BP's Restrictions Weight Bearing Restrictions: No    Mobility  Bed Mobility Overal bed mobility: Needs Assistance Bed Mobility: Supine to Sit;Sit to Supine     Supine to sit: Total assist;+2 for physical assistance Sit to supine:  Total assist;+2 for physical assistance   General bed mobility comments: total A x2 for all bed moblity, strong posterior lean requiring MaxA to maintain upright  Transfers Overall transfer level: Needs assistance Equipment used: Ambulation equipment used Transfers: Sit to/from Stand Sit to Stand: Total assist;+2 physical assistance;From elevated surface         General transfer comment: totalA +2 to barely clear buttocks from elevated bed in stedy, third person to change chucks while her buttocks were cleared from the bed. Unable to make second attempt at standing. No initiation or effort noted  Ambulation/Gait             General Gait Details: non ambulatory   Stairs             Wheelchair Mobility    Modified Rankin (Stroke Patients Only)       Balance Overall balance assessment: Needs assistance Sitting-balance support: Feet supported;Bilateral upper extremity supported Sitting balance-Leahy Scale: Poor Sitting balance - Comments: Max A to maintain upright faded to min guard then at one point of sitting with min guard, patient randomly lost balance and fell backwards onto bed, no attempts to correct Postural control: Posterior lean   Standing balance-Leahy Scale: Zero Standing balance comment: leans forward onto stedy even in sitting                            Cognition Arousal/Alertness: Awake/alert;Lethargic Behavior During Therapy: Flat affect Overall Cognitive Status: No family/caregiver present to determine baseline cognitive functioning  General Comments: no family present to clarify, patient A&Ox0, lethargic, requires vigorous stimulation to open eyes and then needs Max cues/totalA to follow commands. Stares with eyes biased to R, rarely blinks and will reflexively grasp anything placed in her palm      Exercises      General Comments General comments (skin integrity, edema, etc.):  totalAx2 for all mobility, no effort or initiation      Pertinent Vitals/Pain Pain Assessment: Faces Faces Pain Scale: No hurt Pain Intervention(s): Limited activity within patient's tolerance;Monitored during session    Home Living                      Prior Function            PT Goals (current goals can now be found in the care plan section) Acute Rehab PT Goals Patient Stated Goal: none stated, non verbal PT Goal Formulation: Patient unable to participate in goal setting Time For Goal Achievement: 12/20/19 Potential to Achieve Goals: Good Progress towards PT goals: Not progressing toward goals - comment(limited by cognition and lethargy)    Frequency    Min 2X/week      PT Plan Discharge plan needs to be updated;Frequency needs to be updated;Equipment recommendations need to be updated    Co-evaluation              AM-PAC PT "6 Clicks" Mobility   Outcome Measure  Help needed turning from your back to your side while in a flat bed without using bedrails?: A Lot Help needed moving from lying on your back to sitting on the side of a flat bed without using bedrails?: Total Help needed moving to and from a bed to a chair (including a wheelchair)?: Total Help needed standing up from a chair using your arms (e.g., wheelchair or bedside chair)?: Total Help needed to walk in hospital room?: Total Help needed climbing 3-5 steps with a railing? : Total 6 Click Score: 7    End of Session Equipment Utilized During Treatment: Gait belt Activity Tolerance: Other (comment)(limited by cognition and lack of participation) Patient left: with bed alarm set;in bed;with call bell/phone within reach   PT Visit Diagnosis: Unsteadiness on feet (R26.81);Muscle weakness (generalized) (M62.81);Other abnormalities of gait and mobility (R26.89);Difficulty in walking, not elsewhere classified (R26.2)     Time: 7561-2548 PT Time Calculation (min) (ACUTE ONLY): 24  min  Charges:  $Therapeutic Activity: 23-37 mins                     Windell Norfolk, DPT, PN1   Supplemental Physical Therapist [place]    Pager [phone] Acute Rehab Office [phone]

## 2019-12-09 NOTE — Progress Notes (Signed)
PROGRESS NOTE  Jillian Gay [facility]:[id number] DOB: 1932-04-13 DOA: 12/05/2019 PCP: Burnard Bunting, MD  HPI/Recap of past 78 hours: 85 year old female with past medical history significant for CVA with residual right-sided weakness, slurred speech, hypertension, hyperlipidemia, hypothyroidism, who presented with change in mental status, unable to recognize family members since yesterday.  Concern for new CVA, CT head done negative for any acute intracranial findings, showed old CVA.  MRI brain ordered.  Admitted for AMS/CVA work-up.  MRI brain completed 12/21 and showed acute/subacute right ACA stroke.  She had EEG slowing with occasional triphasic waves.  MRI brain 12/21 showed acute/subacute right ACA stroke.  Assessed by speech therapist with recommendation for n.p.o.  Cortrak placed.  Seen by neurology stroke team.  12/09/19: Seen and examined.  Somnolent.  Cortrak in place.  No acute distress.    Assessment/Plan: Active Problems:   Altered mental state   AMS (altered mental status)   Cerebral thrombosis with cerebral infarction  Acute/subacute right ACA CVA in the setting of prior CVA. Seen on MRI brain done on 12/08/2019. LDL 54, goal less than 70 on simvastatin 20 mg daily A1c 6.3 with goal less than 7.0 PT OT assessment recommended CIR Speech therapist recommended n.p.o. Cortrak placed 12/21 2D echo with bubble study and bilateral duplex ultrasound no evidence of PFO or thrombus.  Normal LVEF with grade 1 diastolic dysfunction. Carotid ultrasound unremarkable Continue Plavix Added aspirin 81 mg daily as recommended by neurology stroke team Continue Zocor 20 mg daily  Acute metabolic encephalopathy, likely secondary to acute/subacute right ACA CVA. CT head showed old CVA with no acute intracranial findings Unable to complete MRI brain on 12/06/2019 due to back pain. Reattempt MRI today with findings as above. Elevated TSH 6, free T4 normal Continue home levothyroxine,  switched to IV due to NPO  T12 compression fracture Seen on MRI spine. Pain management in place.  Enterococcus faecalis UTI WBC UA 11-20>> UCX grew 30,000 Enterococcus faecalis, pansensitive. Unable to obtain history from the patient due to altered mental status. Treating with ampicillin x3 days.  History of CVA with right sided weakness Continue PT OT with assistance and fall precautions Recommended CIR placement.  Dysphagia N.p.o. Speech therapist following Cortrak placement 12/21.  Subclinical hypothyroidism TSH elevated 6 Free T4 normal Continue levothyroxine, switched to IV due to NPO  Essential hypertension Resume home meds via tube  Hypokalemia Potassium 3.3 Repleted  DVT prophylaxis: Heparin subcu BID  Code Status: DNR  Family Communication:  Updated husband and granddaughter on 12/08/19.     Disposition Plan:  Discharge when long-term means of nutrition has been established.  Objective: Vitals:   12/09/19 0429 12/09/19 0903 12/09/19 1307 12/09/19 1645  BP: (!) 143/56 (!) 154/65 (!) 152/70 (!) 150/77  Pulse: (!) 58 (!) 59 62 64  Resp: 18 19 19 19   Temp: 98.8 F (37.1 C) 98.5 F (36.9 C) 98.6 F (37 C) 98.2 F (36.8 C)  TempSrc: Axillary Oral Oral Oral  SpO2: 93% 94% 95% 97%  Weight: 92.9 kg     Height:       No intake or output data in the 24 hours ending 12/09/19 2021 Filed Weights   12/07/19 0502 12/08/19 0423 12/09/19 0429  Weight: 92.3 kg 92.4 kg 92.9 kg    Exam:  . General: 85 y.o. year-old female well-developed well-nourished somnolent.  Not interactive. . Cardiovascular: Regular rate and rhythm no rubs or gallops.. . Respiratory: Clear to Auscultation No Wheezes or Rales.  Poor inspiratory  effort. . Abdomen: Soft nontender nondistended normal bowel sounds  . Musculoskeletal: Compression stockings in place bilaterally.   Psychiatry: Unable to assess mood due to somnolence.   CBC: Recent Labs  Lab 12/05/19 1007 12/05/19 1017  12/08/19 0322 12/09/19 0358  WBC 6.3  --  5.7 5.2  NEUTROABS 4.6  --   --   --   HGB 12.2 11.9* 12.4 11.9*  HCT 38.4 35.0* 38.1 36.2  MCV 103.2*  --  100.0 99.2  PLT 214  --  242 251   Basic Metabolic Panel: Recent Labs  Lab 12/05/19 1007 12/05/19 1017 12/07/19 0342 12/08/19 0322 12/09/19 0358  NA 146* 144 144 145 140  K 3.6 3.8 3.6 3.2* 3.3*  CL 113*  --  107 107 106  CO2 24  --  27 28 24   GLUCOSE 128*  --  122* 131* 131*  BUN 16  --  12 11 11   CREATININE 1.03*  --  0.81 0.66 0.76  CALCIUM 8.3*  --  8.7* 8.7* 8.7*  MG  --   --  2.1 2.0  --    GFR: Estimated Creatinine Clearance: 58 mL/min (by C-G formula based on SCr of 0.76 mg/dL). Liver Function Tests: Recent Labs  Lab 12/05/19 1007  AST 26  ALT 23  ALKPHOS 44  BILITOT 0.8  PROT 5.1*  ALBUMIN 2.8*   No results for input(s): LIPASE, AMYLASE in the last 168 hours. Recent Labs  Lab 12/06/19 1823  AMMONIA 13   Coagulation Profile: No results for input(s): INR, PROTIME in the last 168 hours. Cardiac Enzymes: No results for input(s): CKTOTAL, CKMB, CKMBINDEX, TROPONINI in the last 168 hours. BNP (last 3 results) No results for input(s): PROBNP in the last 8760 hours. HbA1C: Recent Labs    12/09/19 0358  HGBA1C 5.8*   CBG: Recent Labs  Lab 12/05/19 0953 12/09/19 1620 12/09/19 1731  GLUCAP 122* 112* 100*   Lipid Profile: No results for input(s): CHOL, HDL, LDLCALC, TRIG, CHOLHDL, LDLDIRECT in the last 72 hours. Thyroid Function Tests: No results for input(s): TSH, T4TOTAL, FREET4, T3FREE, THYROIDAB in the last 72 hours. Anemia Panel: No results for input(s): VITAMINB12, FOLATE, FERRITIN, TIBC, IRON, RETICCTPCT in the last 72 hours. Urine analysis:    Component Value Date/Time   COLORURINE YELLOW 12/05/2019 1600   APPEARANCEUR CLEAR 12/05/2019 1600   LABSPEC 1.013 12/05/2019 1600   PHURINE 6.0 12/05/2019 1600   GLUCOSEU NEGATIVE 12/05/2019 1600   HGBUR NEGATIVE 12/05/2019 1600   BILIRUBINUR  NEGATIVE 12/05/2019 1600   KETONESUR NEGATIVE 12/05/2019 1600   PROTEINUR NEGATIVE 12/05/2019 1600   UROBILINOGEN 0.2 03/02/2010 1143   NITRITE NEGATIVE 12/05/2019 1600   LEUKOCYTESUR TRACE (A) 12/05/2019 1600   Sepsis Labs: @LABRCNTIP (procalcitonin:4,lacticidven:4)  ) Recent Results (from the past 240 hour(s))  SARS CORONAVIRUS 2 (TAT 6-24 HRS) Nasopharyngeal Nasopharyngeal Swab     Status: None   Collection Time: 12/05/19 10:07 AM   Specimen: Nasopharyngeal Swab  Result Value Ref Range Status   SARS Coronavirus 2 NEGATIVE NEGATIVE Final    Comment: (NOTE) SARS-CoV-2 target nucleic acids are NOT DETECTED. The SARS-CoV-2 RNA is generally detectable in upper and lower respiratory specimens during the acute phase of infection. Negative results do not preclude SARS-CoV-2 infection, do not rule out co-infections with other pathogens, and should not be used as the sole basis for treatment or other patient management decisions. Negative results must be combined with clinical observations, patient history, and epidemiological information. The expected result is Negative.  Fact Sheet for Patients: HairSlick.no Fact Sheet for Healthcare Providers: quierodirigir.com This test is not yet approved or cleared by the [place] FDA and  has been authorized for detection and/or diagnosis of SARS-CoV-2 by FDA under an Emergency Use Authorization (EUA). This EUA will remain  in effect (meaning this test can be used) for the duration of the COVID-19 declaration under Section 56 4(b)(1) of the Act, 21 U.S.C. section 360bbb-3(b)(1), unless the authorization is terminated or revoked sooner. Performed at [facility], [address] [place], [place] [address]   Culture, Urine     Status: Abnormal   Collection Time: 12/06/19 11:29 AM   Specimen: Urine, Clean Catch  Result Value Ref Range Status   Specimen Description URINE, CLEAN  CATCH  Final   Special Requests   Final    Normal Performed at [facility], [address] [place], [place] [address]    Culture 30,000 COLONIES/mL ENTEROCOCCUS FAECALIS (A)  Final   Report Status 12/08/2019 FINAL  Final   Organism ID, Bacteria ENTEROCOCCUS FAECALIS (A)  Final      Susceptibility   Enterococcus faecalis - MIC*    AMPICILLIN <=2 SENSITIVE Sensitive     NITROFURANTOIN <=16 SENSITIVE Sensitive     VANCOMYCIN 1 SENSITIVE Sensitive     * 30,000 COLONIES/mL ENTEROCOCCUS FAECALIS      Studies: VAS [place] TRANSCRANIAL DOPPLER  Result Date: 12/09/2019  Transcranial Doppler Indications: Stroke. Limitations: poor windows Limitations for diagnostic windows: Unable to insonate right transtemporal window. Unable to insonate left transtemporal window. Unable to insonate occipital window. Performing Technologist: Jeb Levering RDMS, RVT  Examination Guidelines: A complete evaluation includes B-mode imaging, spectral Doppler, color Doppler, and power Doppler as needed of all accessible portions of each vessel. Bilateral testing is considered an integral part of a complete examination. Limited examinations for reoccurring indications may be performed as noted.  +----------+-------------+----------+-----------+-------+ RIGHT TCD Right VM (cm)Depth (cm)PulsatilityComment +----------+-------------+----------+-----------+-------+ Opthalmic     24.00                 1.41            +----------+-------------+----------+-----------+-------+ ICA siphon    43.00                 1.06            +----------+-------------+----------+-----------+-------+  +----------+------------+----------+-----------+-------+ LEFT TCD  Left VM (cm)Depth (cm)PulsatilityComment +----------+------------+----------+-----------+-------+ Opthalmic    25.00                 1.26            +----------+------------+----------+-----------+-------+ ICA siphon   -62.00                1.22             +----------+------------+----------+-----------+-------+     Preliminary     Scheduled Meds: . aspirin  325 mg Oral Daily  . chlorhexidine  15 mL Mouth Rinse BID  . [START ON 12/10/2019] clopidogrel  75 mg Per Tube Daily  . heparin  5,000 Units Subcutaneous Q12H  . levothyroxine  75 mcg Intravenous Daily  . [START ON 12/10/2019] lisinopril  20 mg Per Tube Daily  . mouth rinse  15 mL Mouth Rinse q12n4p  . polyethylene glycol  17 g Per Tube Daily  . senna-docusate  2 tablet Per Tube BID  . [START ON 12/10/2019] simvastatin  20 mg Per Tube QPM    Continuous Infusions: . ampicillin (OMNIPEN) IV  1 g (12/09/19 1516)  . feeding supplement (JEVITY 1.2 CAL)       LOS: 4 days     Darlin Droparole N Devinn Voshell, MD [facility] Pager [phone]  If 7PM-7AM, please contact night-coverage [email or web address] Password [email or web address] 12/09/2019, 8:21 PM

## 2019-12-09 NOTE — Progress Notes (Signed)
TCD       has been completed. Preliminary results can be found under CV proc through chart review. Shona Pardo, BS, RDMS, RVT   

## 2019-12-09 NOTE — Progress Notes (Signed)
Initial Nutrition Assessment  DOCUMENTATION CODES:   Obesity unspecified  INTERVENTION:   TF via Cortrak: -Jevity 1.2 @ 25 ml/hr , advance by 10 ml every 4 hours to goal rate of 55 ml/hr. -Provides 1584 kcals, 73g protein and 1065 ml H2O.  NUTRITION DIAGNOSIS:   Inadequate oral intake related to lethargy/confusion, dysphagia(s/p CVA) as evidenced by NPO status.  GOAL:   Patient will meet greater than or equal to 90% of their needs  MONITOR:   Labs, Weight trends, TF tolerance, I & O's, Skin  REASON FOR ASSESSMENT:   Consult Assessment of nutrition requirement/status, Enteral/tube feeding initiation and management  ASSESSMENT:   85 year old female with past medical history significant for CVA with residual right-sided weakness, slurred speech, hypertension, hyperlipidemia, hypothyroidism, who presented with change in mental status, unable to recognize family members since yesterday.  Concern for new CVA, CT head done negative for any acute intracranial findings, showed old CVA.  MRI brain ordered.  Admitted for AMS/CVA work-up.  MRI brain completed 12/21 and showed acute/subacute right ACA stroke.  She had EEG slowing with occasional triphasic waves.  12/18: admitted d/t AMS 12/21: Cortrak placed  **RD working remotely**  MD placed consult for RD to manage TF via Cortrak tube.  Per chart review, pt is nonverbal. SLP evaluated 12/21, recommended pt remain NPO.  Per family, pt began to have trouble swallowing the day PTA and needed assistance with eating.   No weight history available in chart PTA. Family didn't report pt has had any weight loss. Admission weight: 201 lbs. Current weight: 204 lbs.  Medications reviewed. Labs reviewed: Low K  NUTRITION - FOCUSED PHYSICAL EXAM:  Working remotely.  Diet Order:   Diet Order            Diet NPO time specified  Diet effective now              EDUCATION NEEDS:   Not appropriate for education at this  time  Skin:  Skin Assessment: Skin Integrity Issues: Skin Integrity Issues:: Other (Comment) Other: left leg  Last BM:  PTA  Height:   Ht Readings from Last 1 Encounters:  12/06/19 5\' 5"  (1.651 m)    Weight:   Wt Readings from Last 1 Encounters:  12/09/19 92.9 kg    Ideal Body Weight:  56.8 kg  BMI:  Body mass index is 34.08 kg/m.  Estimated Nutritional Needs:   Kcal:  1500-1700  Protein:  65-75g  Fluid:  1.7L/day  Clayton Bibles, MS, RD, LDN Inpatient Clinical Dietitian Pager: [phone] After Hours Pager: [phone]

## 2019-12-09 NOTE — Progress Notes (Addendum)
STROKE TEAM PROGRESS NOTE   HISTORY OF PRESENT ILLNESS (per record) This is a debilitated, bedbound 85yr old who was admitted on 12/18 for AMS and increased overall weakness. Apparently at baseline she has some speech, but its slurred. She has spastic right hemiparesis and right hand contracture. She gradually reached to point of being non-verbal since admit. CTH neg for acute stroke. COVID neg. CXR neg for pneumonia. UTI was treated empirically with Rocephin. General neurology was consulted for encephalopathy. EEG showed triphasic waves and gen slowing.    INTERVAL HISTORY Her RN is at the bedside. Pt was being followed by gen neuro team for encephalopathy; now transferred to Stroke service today when MRI showed subacute/actute R ACA infarcts. Pt is abulic and chronically debilitated. She did not participate in exam and did not speak.  I have personally reviewed history of presenting illness, electronic medical records and imaging films in PACS.  Patient has poor neurological baseline with spastic plegia and speech impairment and suspect dementia.  MRI scan of the brain shows a new right anterior cerebral artery infarct  OBJECTIVE Vitals:   12/08/19 2042 12/09/19 0429 12/09/19 0903 12/09/19 1307  BP: (!) 175/68 (!) 143/56 (!) 154/65 (!) 152/70  Pulse: 61 (!) 58 (!) 59 62  Resp: 14 18 19 19   Temp: 99.1 F (37.3 C) 98.8 F (37.1 C) 98.5 F (36.9 C) 98.6 F (37 C)  TempSrc: Oral Axillary Oral Oral  SpO2: 95% 93% 94% 95%  Weight:  92.9 kg    Height:        CBC:  Recent Labs  Lab 12/05/19 1007 12/08/19 0322 12/09/19 0358  WBC 6.3 5.7 5.2  NEUTROABS 4.6  --   --   HGB 12.2 12.4 11.9*  HCT 38.4 38.1 36.2  MCV 103.2* 100.0 99.2  PLT 214 242 251    Basic Metabolic Panel:  Recent Labs  Lab 12/07/19 0342 12/08/19 0322 12/09/19 0358  NA 144 145 140  K 3.6 3.2* 3.3*  CL 107 107 106  CO2 27 28 24   GLUCOSE 122* 131* 131*  BUN 12 11 11   CREATININE 0.81 0.66 0.76  CALCIUM 8.7*  8.7* 8.7*  MG 2.1 2.0  --     Lipid Panel:     Component Value Date/Time   CHOL 131 12/06/2019 0414   TRIG 186 (H) 12/06/2019 0414   HDL 40 (L) 12/06/2019 0414   CHOLHDL 3.3 12/06/2019 0414   VLDL 37 12/06/2019 0414   LDLCALC 54 12/06/2019 0414   HgbA1c:  Lab Results  Component Value Date   HGBA1C 5.8 (H) 12/09/2019   Urine Drug Screen: No results found for: LABOPIA, COCAINSCRNUR, LABBENZ, AMPHETMU, THCU, LABBARB  Alcohol Level No results found for: [facility]  IMAGING   MR BRAIN WO CONTRAST  Result Date: 12/08/2019 CLINICAL DATA:  Altered mental status. History of stroke. EXAM: MRI HEAD WITHOUT CONTRAST TECHNIQUE: Multiplanar, multiecho pulse sequences of the brain and surrounding structures were obtained without intravenous contrast. COMPARISON:  Head CT 12/05/2019 and MRI 02/14/2010 FINDINGS: Brain: There is an acute moderate-sized right ACA territory infarct involving the right paramedian frontal lobe, cingulate gyrus, and corpus callosum with a small amount of petechial hemorrhage. There is also a right ACA infarct more inferiorly in the right frontal lobe which is subacute in appearance with more confluent petechial hemorrhage. 2 adjacent punctate foci of mild hyperintensity on axial trace diffusion imaging in right temporo-occipital white matter are without corresponding reduced ADC. A chronic right parietal infarct is unchanged  from the prior MRI. A moderately large right cerebellar infarct was acute on the prior MRI. Chronic left cerebellar infarcts have increased in number, and chronic infarcts in the left greater than right basal ganglia and thalami have also increased in number. There are also new chronic infarcts in the right corona radiata and pons. There are numerous chronic microhemorrhages in the cerebellum, brainstem, basal ganglia, thalami, and both cerebral hemispheres which have increased in number from the prior MRI and are likely related to hypertension. Confluent T2  hyperintensities in the cerebral white matter bilaterally are nonspecific but compatible with severe chronic small vessel ischemic disease, progressed from the prior MRI. There is advanced cerebral and cerebellar atrophy. No mass, midline shift, or extra-axial fluid collection is identified. Vascular: Major intracranial vascular flow voids are preserved. Skull and upper cervical spine: No suspicious marrow lesion. Interbody ankylosis at C3-4 and C4-5. Sinuses/Orbits: Bilateral cataract extraction. Small left maxillary sinus mucous retention cyst. Trace left mastoid air cell fluid. Other: None. IMPRESSION: 1. Moderate-sized acute and subacute right ACA infarcts. 2. Progressive severe chronic small vessel ischemic disease with numerous chronic infarcts and microhemorrhages as above. Electronically Signed   By: Sebastian Ache M.D.   On: 12/08/2019 08:55   MR LUMBAR SPINE WO CONTRAST  Result Date: 12/08/2019 CLINICAL DATA:  Back pain. EXAM: MRI LUMBAR SPINE WITHOUT CONTRAST TECHNIQUE: Multiplanar, multisequence MR imaging of the lumbar spine was performed. No intravenous contrast was administered. COMPARISON:  Lumbar spine radiographs 11/10/2019. CT abdomen and pelvis 09/25/2017. FINDINGS: Segmentation:  Standard. Alignment: Mild-to-moderate lumbar dextroscoliosis. 3 mm retrolisthesis of L1 on L2. Vertebrae: The T12 compression fracture questioned on the prior radiographs is confirmed and demonstrates approximately 50% vertebral body height loss which has progressed in the interim. There is marrow edema throughout the vertebral body, and there is a fluid-filled fracture cleft in the anteroinferior vertebral body. There is no retropulsion. There is mild marrow edema posteriorly along the L1 superior endplate with a shallow Schmorl's node type deformity. There is heterogeneous fatty bone marrow signal diffusely without a suspicious focal marrow lesion identified. Multilevel chronic degenerative endplate marrow changes  are present. Conus medullaris and cauda equina: Conus extends to the upper L2 level. Conus and cauda equina appear normal. Paraspinal and other soft tissues: Chronic moderate left hydronephrosis with a stone at the ureteropelvic junction as demonstrated on the 2018 CT. Bilateral renal cysts. Moderate paravertebral soft tissue edema from T11-L1 associated with the T12 fracture. Disc levels: Severe disc space narrowing greater on the left at L1-2 and L2-3 and greater on the right at L4-5 and L5-S1. T11-12: Minimal disc bulging, ligamentum flavum hypertrophy, and moderate facet arthrosis result in mild spinal stenosis and mild right neural foraminal stenosis. T12-L1: Mild disc bulging and moderate facet arthrosis without significant stenosis. L1-2: Retrolisthesis with bulging uncovered disc and mild facet and ligamentum flavum hypertrophy result in minimal left lateral recess stenosis without spinal or neural foraminal stenosis. L2-3: Mild disc bulging and endplate spurring eccentric to the left and mild facet and ligamentum flavum hypertrophy result in mild left neural foraminal stenosis without spinal stenosis. L3-4: Mild disc bulging eccentric to the left and mild facet and ligamentum flavum hypertrophy result in mild spinal stenosis and mild left neural foraminal stenosis. L4-5: Circumferential disc bulging and moderate right and mild left facet and ligamentum flavum hypertrophy result in mild right neural foraminal stenosis without significant spinal stenosis. L5-S1: Prior right laminectomy. Disc bulging, endplate spurring, severe disc space height loss, and mild facet hypertrophy  result in mild right and moderate left neural foraminal stenosis without spinal stenosis. IMPRESSION: 1. Subacute T12 compression fracture with 50% height loss, increased from prior radiographs. 2. Diffuse disc and facet degeneration with mild spinal stenosis at T11-12 and L3-4. 3. Multilevel neural foraminal stenosis, moderate on the  left at L5-S1. 4. 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 Electronically Signed   By: Sebastian Ache M.D.   On: 12/08/2019 09:47   ECHOCARDIOGRAM COMPLETE BUBBLE STUDY  Result Date: 12/08/2019   ECHOCARDIOGRAM REPORT   Patient Name:   Jillian Gay Date of Exam: 12/08/2019 Medical Rec #:  [id number]       Height:       65.0 in Accession #:    [id number]      Weight:       203.6 lb Date of Birth:  [birth year]       BSA:          1.99 m Patient Age:    87 years        BP:           160/61 mmHg Patient Gender: F               HR:           64 bpm. Exam Location:  Inpatient Procedure: 2D Echo and Saline Contrast Bubble Study Indications:    Stroke I63.9  History:        Patient has no prior history of Echocardiogram examinations.                 Risk Factors:Hypertension.  Sonographer:    Thurman Coyer RDCS (AE) Referring Phys: [id number] CAROLE N HALL IMPRESSIONS  1. Left ventricular ejection fraction, by visual estimation, is 60 to 65%. The left ventricle has normal function. There is no left ventricular hypertrophy.  2. Left ventricular diastolic parameters are consistent with Grade I diastolic dysfunction (impaired relaxation).  3. The left ventricle has no regional wall motion abnormalities.  4. Global right ventricle has normal systolic function.The right ventricular size is normal. No increase in right ventricular wall thickness.  5. Left atrial size was moderately dilated.  6. Right atrial size was normal.  7. Mild mitral annular calcification.  8. The mitral valve is normal in structure. Mild mitral valve regurgitation. No evidence of mitral stenosis.  9. The tricuspid valve is normal in structure. Tricuspid valve regurgitation is mild. 10. The aortic valve is normal in structure. Aortic valve regurgitation is not visualized. No evidence of aortic valve sclerosis or stenosis. 11. The pulmonic valve was normal in structure. Pulmonic valve regurgitation is not visualized. 12. Mildly  elevated pulmonary artery systolic pressure. 13. The inferior vena cava is normal in size with greater than 50% respiratory variability, suggesting right atrial pressure of 3 mmHg. 14. No intracardiac thrombi or masses were visualized. FINDINGS  Left Ventricle: Left ventricular ejection fraction, by visual estimation, is 60 to 65%. The left ventricle has normal function. The left ventricle has no regional wall motion abnormalities. There is no left ventricular hypertrophy. Left ventricular diastolic parameters are consistent with Grade I diastolic dysfunction (impaired relaxation). Normal left atrial pressure. Right Ventricle: The right ventricular size is normal. No increase in right ventricular wall thickness. Global RV systolic function is has normal systolic function. The tricuspid regurgitant velocity is 2.70 m/s, and with an assumed right atrial pressure  of 8 mmHg, the estimated right ventricular systolic pressure is mildly elevated at 37.2 mmHg. Left  Atrium: Left atrial size was moderately dilated. Right Atrium: Right atrial size was normal in size Pericardium: There is no evidence of pericardial effusion. Mitral Valve: The mitral valve is normal in structure. Mild mitral annular calcification. Mild mitral valve regurgitation. No evidence of mitral valve stenosis by observation. Tricuspid Valve: The tricuspid valve is normal in structure. Tricuspid valve regurgitation is mild. Aortic Valve: The aortic valve is normal in structure. Aortic valve regurgitation is not visualized. The aortic valve is structurally normal, with no evidence of sclerosis or stenosis. Pulmonic Valve: The pulmonic valve was normal in structure. Pulmonic valve regurgitation is not visualized. Pulmonic regurgitation is not visualized. Aorta: The aortic root, ascending aorta and aortic arch are all structurally normal, with no evidence of dilitation or obstruction. Venous: The inferior vena cava is normal in size with greater than 50%  respiratory variability, suggesting right atrial pressure of 3 mmHg. IAS/Shunts: No atrial level shunt detected by color flow Doppler. Agitated saline contrast was given intravenously to evaluate for intracardiac shunting. Saline contrast bubble study was negative, with no evidence of any interatrial shunt. There is no evidence of a patent foramen ovale. No ventricular septal defect is seen or detected. There is no evidence of an atrial septal defect. Additional Comments: No intracardiac thrombi or masses were visualized.  LEFT VENTRICLE PLAX 2D LVIDd:         4.30 cm  Diastology LVIDs:         3.20 cm  LV e' lateral:   9.14 cm/s LV PW:         1.10 cm  LV E/e' lateral: 8.9 LV IVS:        1.10 cm  LV e' medial:    5.33 cm/s LVOT diam:     2.20 cm  LV E/e' medial:  15.3 LV SV:         42 ml LV SV Index:   20.10 LVOT Area:     3.80 cm  RIGHT VENTRICLE RV S prime:     15.30 cm/s TAPSE (M-mode): 2.8 cm LEFT ATRIUM             Index       RIGHT ATRIUM           Index LA diam:        4.90 cm 2.46 cm/m  RA Area:     18.40 cm LA Vol (A2C):   94.5 ml 47.41 ml/m RA Volume:   48.90 ml  24.53 ml/m LA Vol (A4C):   60.3 ml 30.25 ml/m LA Biplane Vol: 75.6 ml 37.93 ml/m  AORTIC VALVE LVOT Vmax:   91.70 cm/s LVOT Vmean:  57.200 cm/s LVOT VTI:    0.213 m  AORTA Ao Root diam: 2.90 cm MITRAL VALVE                        TRICUSPID VALVE MV Area (PHT): 2.80 cm             TR Peak grad:   29.2 mmHg MV PHT:        78.59 msec           TR Vmax:        270.00 cm/s MV Decel Time: 271 msec MR Peak grad: 88.0 mmHg             SHUNTS MR Vmax:      469.00 cm/s           Systemic VTI:  0.21  m MV E velocity: 81.80 cm/s 103 cm/s  Systemic Diam: 2.20 cm MV A velocity: 81.80 cm/s 70.3 cm/s MV E/A ratio:  1.00       1.5  Donato SchultzMark Skains MD Electronically signed by Donato SchultzMark Skains MD Signature Date/Time: 12/08/2019/4:26:09 PM    Final    carotid doppler/carotid us  Result Date: 12/09/2019 Carotid Arterial Duplex Study Indications:       CVA. Risk  Factors:      Prior CVA. Limitations        Today's exam was limited due to the patient's inability or                    unwillingness to cooperate. Comparison Study:  no prior Performing Technologist: Blanch MediaMegan Riddle RVS  Examination Guidelines: A complete evaluation includes B-mode imaging, spectral Doppler, color Doppler, and power Doppler as needed of all accessible portions of each vessel. Bilateral testing is considered an integral part of a complete examination. Limited examinations for reoccurring indications may be performed as noted.  Right Carotid Findings: +----------+--------+--------+--------+------------------+--------+           PSV cm/sEDV cm/sStenosisPlaque DescriptionComments +----------+--------+--------+--------+------------------+--------+ CCA Prox  40      7               heterogenous               +----------+--------+--------+--------+------------------+--------+ CCA Distal45      7               heterogenous               +----------+--------+--------+--------+------------------+--------+ ICA Prox  50      12      1-39%   heterogenous               +----------+--------+--------+--------+------------------+--------+ ICA Distal45      7                                          +----------+--------+--------+--------+------------------+--------+ ECA       77      6                                          +----------+--------+--------+--------+------------------+--------+ +----------+--------+-------+--------+-------------------+           PSV cm/sEDV cmsDescribeArm Pressure (mmHG) +----------+--------+-------+--------+-------------------+ [id number]                                         +----------+--------+-------+--------+-------------------+ +---------+--------+--+--------+-+---------+ VertebralPSV cm/s55EDV cm/s7Antegrade +---------+--------+--+--------+-+---------+  Left Carotid Findings:  +----------+--------+--------+--------+------------------+--------------+           PSV cm/sEDV cm/sStenosisPlaque DescriptionComments       +----------+--------+--------+--------+------------------+--------------+ CCA Prox  45      8               heterogenous                     +----------+--------+--------+--------+------------------+--------------+ CCA Distal47      6               heterogenous                     +----------+--------+--------+--------+------------------+--------------+ ICA Prox  63      11      1-39%   heterogenous                     +----------+--------+--------+--------+------------------+--------------+ ICA Distal                                          Not visualized +----------+--------+--------+--------+------------------+--------------+ ECA       132     7                                                +----------+--------+--------+--------+------------------+--------------+ +----------+--------+--------+--------+-------------------+           PSV cm/sEDV cm/sDescribeArm Pressure (mmHG) +----------+--------+--------+--------+-------------------+ [id number]                                          +----------+--------+--------+--------+-------------------+ +---------+--------+--+--------+-+---------+ VertebralPSV cm/s19EDV cm/s5Antegrade +---------+--------+--+--------+-+---------+  Summary: Right Carotid: Velocities in the right ICA are consistent with a 1-39% stenosis. Left Carotid: Velocities in the left ICA are consistent with a 1-39% stenosis. Vertebrals: Bilateral vertebral arteries demonstrate antegrade flow. *See table(s) above for measurements and observations.  Electronically signed by Delia Heady MD on 12/09/2019 at 12:02:15 PM.    Final    EEG This is an abnormal electroencephalogram secondary to background slowing with occasional triphasic waves consistent with an encephalopathy of nonspecific etiology.     PHYSICAL EXAM Blood pressure (!) 152/70, pulse 62, temperature 98.6 F (37 C), temperature source Oral, resp. rate 19, height  (1.651 m), weight 92.9 kg, SpO2 95 %. GENERAL: Awake, alert in NAD HENT: - Normocephalic and atraumatic, dry mm, Eyes: normal conjunctiva LUNGS - Clear/diminished to auscultation bilaterally with no wheezes CV - S1S2 RRR, no m/r/g, equal pulses bilaterally. ABDOMEN - Soft, nontender, nondistended with normoactive BS Ext: warm, well perfused, intact peripheral pulses, no  Edema, Bilateral lower extremities wrapped in ace wrap PSYCH: calm   NEURO:  Mental Status: Awake, eyes open.  She does follow gaze but does not follow commands consistently.  She will blink to threat on both sides patient is nonverbal and does not follow commands. No attempts at speaking.  Language: Patient is mute and makes only few guttural sounds.  Suspect severe pseudobulbar state with possible some abulia from the current ACA stroke c Cranial Nerves: PERRL 25mm/sluggish. Tracks briefly, visual fields full to threat,right facial asymmetry, facial sensation intact,.  Diminished facial expression bilaterally hearing intact, tongue/uvula/soft palate midline, normal sternocleidomastoid and trapezius muscle strength. No evidence of tongue atrophy or fibrillations.  Jaw jerk is brisk.  Palmomental and grasp reflexes are present bilaterally Motor: Right upper extremity able to hold up but drifts to bed.  Left upper holds up with slight drift.  Right hand is contracted.  Did not move bilateral legs when told but did to noxious stimuli Tone: is increased bilaterally right more than left l and bulk is normal Sensation- Intact to noxious stimuli Coordination: unable to test Gait- deferred   1a Level of Conscious.: alert 0 1b LOC Questions: none correct 2 1c LOC Commands: performs neither correctly 2 2 Best Gaze: normal 0 3 Visual: no visual loss 0 4 Facial  Palsy: minor 1 5a Motor Arm -  left: drift 2 5b Motor Arm - Right: some effort against gravity 3 6a Motor Leg - Left: no effort against gravity 3 6b Motor Leg - Right: no effort against gravity 3 7 Limb Ataxia: absent 0 8 Sensory: normal 0 9 Best Language: global 3 10 Dysarthria: severe 3 11 Extinct. and Inatten.: no abnormality 0 TOTAL: 22 NIHSS 22  HOME MEDICATIONS:  Medications Prior to Admission  Medication Sig Dispense Refill  . calcitonin, salmon, (MIACALCIN/FORTICAL) 200 UNIT/ACT nasal spray Place 1 spray into alternate nostrils daily.    . calcium carbonate (OS-CAL) 600 MG TABS Take 600 mg by mouth 2 (two) times daily with a meal.    . cholecalciferol (VITAMIN D) 400 UNITS TABS Take 400 Units by mouth.    . clopidogrel (PLAVIX) 75 MG tablet Take 75 mg by mouth daily.    Marland Kitchen doxycycline (VIBRA-TABS) 100 MG tablet Take 100 mg by mouth 2 (two) times daily.    . furosemide (LASIX) 40 MG tablet Take 20 mg by mouth daily.     Marland Kitchen KLOR-CON M20 20 MEQ tablet Take 20 mEq by mouth daily.    Marland Kitchen levothyroxine (SYNTHROID, LEVOTHROID) 125 MCG tablet Take 125 mcg by mouth daily.    Marland Kitchen lisinopril (PRINIVIL,ZESTRIL) 20 MG tablet Take 20 mg by mouth daily.    . Multiple Vitamins-Minerals (MULTIVITAMIN WITH MINERALS) tablet Take 1 tablet by mouth daily.    . simvastatin (ZOCOR) 20 MG tablet Take 20 mg by mouth every evening.    . vitamin C (ASCORBIC ACID) 500 MG tablet Take 500 mg by mouth 2 (two) times daily.        HOSPITAL MEDICATIONS:  . chlorhexidine  15 mL Mouth Rinse BID  . clopidogrel  75 mg Oral Daily  . enalaprilat  0.625 mg Intravenous Q6H  . heparin  5,000 Units Subcutaneous Q12H  . levothyroxine  75 mcg Intravenous Daily  . lisinopril  20 mg Oral Daily  . mouth rinse  15 mL Mouth Rinse q12n4p  . polyethylene glycol  17 g Oral Daily  . senna-docusate  2 tablet Oral BID  . simvastatin  20 mg Oral QPM    ALLERGIES Allergies  Allergen Reactions  . Celebrex [Celecoxib]   . Macrodantin   . Metoprolol   .  Norvasc [Amlodipine Besylate]    ASSESSMENT/PLAN Jillian Gay is a 85 y.o. female who is very debilitated and bedbound at baseline with past medical history of HTN, HLD, hypothyroidism and prior strokes with deficits, vascular dementia. Per chart, she has progressively became weaker, developed confusion. UTI was found and treated.   Stroke: Acute/subacute Right ACA infarcts  Resultant abulic state with severe pseudobulbar affect -difficult to discern new deficits d/t extensive underlying debility and prev stroke.   Code Stroke CT Head -    ASPECTSunable  CT head - multiple old infarcts bilaterally. No acute findings  MRI head-Acute/subacute Right ACA infarcts. Progressive severe chronic small vessel ischemic disease and numerous chronic infarcts and microhemorrhages.  MRA head - unable to visualize the SCA  CTA H&N - not done  CT Perfusion- not done  Carotid Doppler -bilat ICA 1-39%  2D Echo - pending  Sars Corona Virus 2  neg  LDL - 54    Component Value Date/Time   LDLCALC 54 12/06/2019 0414     HgbA1c - 5.8  UDS not done  VTE prophylaxis - heparin sq Diet  Diet Order  Diet NPO time specified  Diet effective now               Plavix prior to admission, now on Plavix  Unable to be counseled to be compliant with her antithrombotic medications, d/t baseline dementis  Ongoing aggressive stroke risk factor management  Therapy recommendations:  pending  Disposition:  Pending  Hypertension  Home BP meds: Lasix, zestril, zocor,   Current BP meds: Zestril, apresoline, vasotec  Stable . No role in Permissive hypertension at this time d/t subacute finding . Long-term BP goal normotensive  Hyperlipidemia  Home Lipid lowering medication: simvastatin  daily  LDL 54, goal < 70  Current lipid lowering medication: Simvastatin  daily  Continue statin at discharge  Diabetes  Home diabetic meds: none   Current diabetic meds:  SSI   HgbA1c 6.4, goal < 7.0  No results for input(s): GLUCAP in the last 72 hours.  Other Stroke Risk Factors  Advanced age  Obesity, Body mass index is 34.08 kg/m., recommend weight loss, diet and exercise as appropriate   Hx stroke/TIA  Coronary artery disease  Other Active Problems  UTI with AMS- being tx with Abx  Baseline vascular dementia  Severe debility at baseline- pt has not ambulated in over 18yrs (mRS 5)  Recommend Palliative Care consult for clear GOC. Do not think she will be able to participate in rehab efforts at this time  Hospital day # 4  Desiree Metzger-Cihelka, ARNP-C, ANVP-BC Pager: [phone]  I have personally obtained history,examined this patient, reviewed notes, independently viewed imaging studies, participated in medical decision making and plan of care.ROS completed by me personally and pertinent positives fully documented  I have made any additions or clarifications directly to the above note. Agree with note above.. She has presented with altered mental status and confusion in setting of UTI with a new right anterior cerebral artery infarct with background MRI showing multiple old infarcts and extensive changes of small vessel disease.  Her neurological exam is consistent with severe pseudobulbar state and abulia and baseline vascular dementia.  She is not a candidate for aggressive stroke work-up or treatment.  Recommend continue aspirin and Plavix and check echocardiogram.  Therapy consults.  Treat UTI as per primary team.  Discussed with Dr. Margo Aye and answered questions.  Greater than 50% time during this 35-minute visit was spent on counseling and coordination of care and discussion with care team.  Delia Heady, MD Medical Director [facility] Pager: [phone] 12/09/2019 4:38 PM  To contact Stroke Continuity provider, please refer to [email or web address]. After hours, contact General Neurology

## 2019-12-10 LAB — BASIC METABOLIC PANEL
Anion gap: 10 (ref 5–15)
BUN: 14 mg/dL (ref 8–23)
CO2: 24 mmol/L (ref 22–32)
Calcium: 9 mg/dL (ref 8.9–10.3)
Chloride: 111 mmol/L (ref 98–111)
Creatinine, Ser: 0.71 mg/dL (ref 0.44–1.00)
GFR calc Af Amer: >60 mL/min (ref >60)
GFR calc non Af Amer: >60 mL/min (ref >60)
Glucose, Bld: 155 mg/dL — ABNORMAL HIGH (ref 70–99)
Potassium: 4 mmol/L (ref 3.5–5.1)
Sodium: 145 mmol/L (ref 135–145)

## 2019-12-10 LAB — CBC
HCT: 37.6 % (ref 36.0–46.0)
Hemoglobin: 12.4 g/dL (ref 12.0–15.0)
MCH: 32.9 pg (ref 26.0–34.0)
MCHC: 33 g/dL (ref 30.0–36.0)
MCV: 99.7 fL (ref 80.0–100.0)
Platelets: 206 10*3/uL (ref 150–400)
RBC: 3.77 MIL/uL — ABNORMAL LOW (ref 3.87–5.11)
RDW: 14.4 % (ref 11.5–15.5)
WBC: 4.5 10*3/uL (ref 4.0–10.5)
nRBC: 0 % (ref 0.0–0.2)

## 2019-12-10 LAB — GLUCOSE, CAPILLARY
Glucose-Capillary: 129 mg/dL — ABNORMAL HIGH (ref 70–99)
Glucose-Capillary: 134 mg/dL — ABNORMAL HIGH (ref 70–99)
Glucose-Capillary: 135 mg/dL — ABNORMAL HIGH (ref 70–99)
Glucose-Capillary: 136 mg/dL — ABNORMAL HIGH (ref 70–99)
Glucose-Capillary: 140 mg/dL — ABNORMAL HIGH (ref 70–99)
Glucose-Capillary: 140 mg/dL — ABNORMAL HIGH (ref 70–99)

## 2019-12-10 MED ORDER — AMPICILLIN 500 MG PO CAPS
500.0000 mg | ORAL_CAPSULE | Freq: Four times a day (QID) | ORAL | Status: DC
  Administered 2019-12-10: 500 mg via ORAL
  Filled 2019-12-10 (×4): qty 1

## 2019-12-10 MED ORDER — LEVOTHYROXINE SODIUM 25 MCG PO TABS
125.0000 ug | ORAL_TABLET | Freq: Every day | ORAL | Status: DC
  Administered 2019-12-11 – 2019-12-12 (×2): 125 ug via ORAL
  Filled 2019-12-10 (×2): qty 1

## 2019-12-10 MED ORDER — POTASSIUM CHLORIDE 20 MEQ/15ML (10%) PO SOLN
20.0000 meq | Freq: Once | ORAL | Status: AC
  Administered 2019-12-10: 20 meq
  Filled 2019-12-10: qty 15

## 2019-12-10 MED ORDER — ASPIRIN 325 MG PO TABS
325.0000 mg | ORAL_TABLET | Freq: Every day | ORAL | Status: DC
  Administered 2019-12-11 – 2019-12-15 (×5): 325 mg
  Filled 2019-12-10 (×5): qty 1

## 2019-12-10 MED ORDER — AMPICILLIN 500 MG PO CAPS
500.0000 mg | ORAL_CAPSULE | Freq: Four times a day (QID) | ORAL | Status: AC
  Administered 2019-12-10 – 2019-12-11 (×5): 500 mg
  Filled 2019-12-10 (×5): qty 1

## 2019-12-10 NOTE — Progress Notes (Signed)
Marland Kitchen.  PROGRESS NOTE    Jillian Gay  WUJ:[id number] DOB: 07-07-1932 DOA: 12/05/2019 PCP: Geoffry ParadiseAronson, Richard, MD   Brief Narrative:   85 year old female with past medical history significant for CVA with residual right-sided weakness, slurred speech, hypertension, hyperlipidemia, hypothyroidism, who presented with change in mental status, unable to recognize family members since yesterday.  Concern for new CVA, CT head done negative for any acute intracranial findings, showed old CVA.  MRI brain ordered.  Admitted for AMS/CVA work-up.  MRI brain completed 12/21 and showed acute/subacute right ACA stroke.  She had EEG slowing with occasional triphasic waves.  12/10/19: No acute events ON.    Assessment & Plan:   Active Problems:   Altered mental state   AMS (altered mental status)   Cerebral thrombosis with cerebral infarction   Acute/subacute right ACA CVA in the setting of prior CVA.     - positive MRI brain 12/08/2019.     - LDL 54, goal less than 70; continue simvastatin 20 mg daily     - A1c 6.3; goal less than 7.0     - PT/OT rec CIR; Inpt Rehab eval'd, feel she is more appropriate for SNF     - SLP rec n.p.o; Cortrak placed 12/21; need permanent solution, will d/w family     - 2D echo with bubble study and bilateral duplex ultrasound no evidence of PFO or thrombus.      - Normal LVEF with grade 1 diastolic dysfunction.     - Carotid ultrasound unremarkable     - Continue plavix, aspirin 81 mg daily as recommended by neurology stroke team  Acute metabolic encephalopathy, likely secondary to acute/subacute right ACA CVA.     - CT head showed old CVA with no acute intracranial findings     - elevated TSH 6, free T4 normal     - Continue home levothyroxine, switched to IV due to NPO  T12 compression fracture     - Seen on MRI spine.     - Pain management in place.  Enterococcus faecalis UTI     - WBC UA 11-20>> UCX grew 30,000 Enterococcus faecalis, pansensitive.     -  ampicillin  History of CVA with right sided weakness     - Continue PT OT with assistance and fall precautions     - Recommended CIR placement; Inpt Rehab eval'd, feel she is more appropriate for SNF  Dysphagia     - SLP rec NPO     - Cortrak placement 12/21; need permanent solution  Subclinical hypothyroidism     - TSH elevated 6     - Free T4 normal     - Continue levothyroxine, switched to IV due to NPO  Essential hypertension     - lisinopril  Hypokalemia     - resolved  DVT prophylaxis: heparin Code Status: DNR   Disposition Plan: TBD  Consultants:   Neurology  Antimicrobials:  . ampicillin   Subjective: No acute events ON.   Objective: Vitals:   12/10/19 0121 12/10/19 0407 12/10/19 0837 12/10/19 1121  BP: (!) 120/54 137/65 (!) 137/56 (!) 157/56  Pulse: 69 76  (!) 58  Resp: 16 19    Temp:  98.9 F (37.2 C)  97.6 F (36.4 C)  TempSrc:  Axillary  Oral  SpO2: 93% 99%  97%  Weight:  92.4 kg    Height:        Intake/Output Summary (Last 24 hours) at 12/10/2019 1417 Last  data filed at 12/10/2019 0600 Gross per 24 hour  Intake --  Output 500 ml  Net -500 ml   Filed Weights   12/08/19 0423 12/09/19 0429 12/10/19 0407  Weight: 92.4 kg 92.9 kg 92.4 kg    Examination:  General: 85 y.o. female resting in bed in NAD Cardiovascular: RRR, +S1, S2, no m/g/r, equal pulses throughout Respiratory: CTABL, no w/r/r, normal WOB GI: BS+, NDNT, no masses noted, no organomegaly noted MSK: No e/c/c Neuro: somnolent   Data Reviewed: I have personally reviewed following labs and imaging studies.  CBC: Recent Labs  Lab 12/05/19 1007 12/05/19 1017 12/08/19 0322 12/09/19 0358 12/10/19 0945  WBC 6.3  --  5.7 5.2 4.5  NEUTROABS 4.6  --   --   --   --   HGB 12.2 11.9* 12.4 11.9* 12.4  HCT 38.4 35.0* 38.1 36.2 37.6  MCV 103.2*  --  100.0 99.2 99.7  PLT 214  --  242 251 206   Basic Metabolic Panel: Recent Labs  Lab 12/05/19 1007 12/05/19 1017  12/07/19 0342 12/08/19 0322 12/09/19 0358 12/10/19 0945  NA 146* 144 144 145 140 145  K 3.6 3.8 3.6 3.2* 3.3* 4.0  CL 113*  --  107 107 106 111  CO2 24  --  GLUCOSE 128*  --  122* 131* 131* 155*  BUN 16  --  CREATININE 1.03*  --  0.81 0.66 0.76 0.71  CALCIUM 8.3*  --  8.7* 8.7* 8.7* 9.0  MG  --   --  2.1 2.0  --   --    GFR: Estimated Creatinine Clearance: 57.8 mL/min (by C-G formula based on SCr of 0.71 mg/dL). Liver Function Tests: Recent Labs  Lab 12/05/19 1007  AST 26  ALT 23  ALKPHOS 44  BILITOT 0.8  PROT 5.1*  ALBUMIN 2.8*   No results for input(s): LIPASE, AMYLASE in the last 168 hours. Recent Labs  Lab 12/06/19 1823  AMMONIA 13   Coagulation Profile: No results for input(s): INR, PROTIME in the last 168 hours. Cardiac Enzymes: No results for input(s): CKTOTAL, CKMB, CKMBINDEX, TROPONINI in the last 168 hours. BNP (last 3 results) No results for input(s): PROBNP in the last 8760 hours. HbA1C: Recent Labs    12/09/19 0358  HGBA1C 5.8*   CBG: Recent Labs  Lab 12/09/19 2042 12/09/19 2253 12/10/19 0412 12/10/19 0748 12/10/19 1118  GLUCAP 100* 114* 129* 134* 140*   Lipid Profile: No results for input(s): CHOL, HDL, LDLCALC, TRIG, CHOLHDL, LDLDIRECT in the last 72 hours. Thyroid Function Tests: No results for input(s): TSH, T4TOTAL, FREET4, T3FREE, THYROIDAB in the last 72 hours. Anemia Panel: No results for input(s): VITAMINB12, FOLATE, FERRITIN, TIBC, IRON, RETICCTPCT in the last 72 hours. Sepsis Labs: No results for input(s): PROCALCITON, LATICACIDVEN in the last 168 hours.  Recent Results (from the past 240 hour(s))  SARS CORONAVIRUS 2 (TAT 6-24 HRS) Nasopharyngeal Nasopharyngeal Swab     Status: None   Collection Time: 12/05/19 10:07 AM   Specimen: Nasopharyngeal Swab  Result Value Ref Range Status   SARS Coronavirus 2 NEGATIVE NEGATIVE Final    Comment: (NOTE) SARS-CoV-2 target nucleic acids are NOT  DETECTED. The SARS-CoV-2 RNA is generally detectable in upper and lower respiratory specimens during the acute phase of infection. Negative results do not preclude SARS-CoV-2 infection, do not rule out co-infections with other pathogens, and should not be used as the sole basis for treatment  or other patient management decisions. Negative results must be combined with clinical observations, patient history, and epidemiological information. The expected result is Negative. Fact Sheet for Patients: HairSlick.no Fact Sheet for Healthcare Providers: quierodirigir.com This test is not yet approved or cleared by the [place] FDA and  has been authorized for detection and/or diagnosis of SARS-CoV-2 by FDA under an Emergency Use Authorization (EUA). This EUA will remain  in effect (meaning this test can be used) for the duration of the COVID-19 declaration under Section 56 4(b)(1) of the Act, 21 U.S.C. section 360bbb-3(b)(1), unless the authorization is terminated or revoked sooner. Performed at [facility], [address], [place], [place] [address]   Culture, Urine     Status: Abnormal   Collection Time: 12/06/19 11:29 AM   Specimen: Urine, Clean Catch  Result Value Ref Range Status   Specimen Description URINE, CLEAN CATCH  Final   Special Requests   Final    Normal Performed at [facility], [address] [place], [place] [address]    Culture 30,000 COLONIES/mL ENTEROCOCCUS FAECALIS (A)  Final   Report Status 12/08/2019 FINAL  Final   Organism ID, Bacteria ENTEROCOCCUS FAECALIS (A)  Final      Susceptibility   Enterococcus faecalis - MIC*    AMPICILLIN <=2 SENSITIVE Sensitive     NITROFURANTOIN <=16 SENSITIVE Sensitive     VANCOMYCIN 1 SENSITIVE Sensitive     * 30,000 COLONIES/mL ENTEROCOCCUS FAECALIS      Radiology Studies: ECHOCARDIOGRAM COMPLETE BUBBLE STUDY  Result Date: 12/08/2019    ECHOCARDIOGRAM REPORT   Patient Name:   Jillian Gay Date of Exam: 12/08/2019 Medical Rec #:  [id number]       Height:       65.0 in Accession #:    [id number]      Weight:       203.6 lb Date of Birth:  Feb 03, 1932       BSA:          1.99 m Patient Age:    85 years        BP:           160/61 mmHg Patient Gender: F               HR:           64 bpm. Exam Location:  Inpatient Procedure: 2D Echo and Saline Contrast Bubble Study Indications:    Stroke I63.9  History:        Patient has no prior history of Echocardiogram examinations.                 Risk Factors:Hypertension.  Sonographer:    Thurman Coyer RDCS (AE) Referring Phys: [id number] CAROLE N HALL IMPRESSIONS  1. Left ventricular ejection fraction, by visual estimation, is 60 to 65%. The left ventricle has normal function. There is no left ventricular hypertrophy.  2. Left ventricular diastolic parameters are consistent with Grade I diastolic dysfunction (impaired relaxation).  3. The left ventricle has no regional wall motion abnormalities.  4. Global right ventricle has normal systolic function.The right ventricular size is normal. No increase in right ventricular wall thickness.  5. Left atrial size was moderately dilated.  6. Right atrial size was normal.  7. Mild mitral annular calcification.  8. The mitral valve is normal in structure. Mild mitral valve regurgitation. No evidence of mitral stenosis.  9. The tricuspid valve is normal in structure. Tricuspid valve regurgitation is mild.  10. The aortic valve is normal in structure. Aortic valve regurgitation is not visualized. No evidence of aortic valve sclerosis or stenosis. 11. The pulmonic valve was normal in structure. Pulmonic valve regurgitation is not visualized. 12. Mildly elevated pulmonary artery systolic pressure. 13. The inferior vena cava is normal in size with greater than 50% respiratory variability, suggesting right atrial pressure of 3 mmHg. 14. No intracardiac thrombi or masses were  visualized. FINDINGS  Left Ventricle: Left ventricular ejection fraction, by visual estimation, is 60 to 65%. The left ventricle has normal function. The left ventricle has no regional wall motion abnormalities. There is no left ventricular hypertrophy. Left ventricular diastolic parameters are consistent with Grade I diastolic dysfunction (impaired relaxation). Normal left atrial pressure. Right Ventricle: The right ventricular size is normal. No increase in right ventricular wall thickness. Global RV systolic function is has normal systolic function. The tricuspid regurgitant velocity is 2.70 m/s, and with an assumed right atrial pressure  of 8 mmHg, the estimated right ventricular systolic pressure is mildly elevated at 37.2 mmHg. Left Atrium: Left atrial size was moderately dilated. Right Atrium: Right atrial size was normal in size Pericardium: There is no evidence of pericardial effusion. Mitral Valve: The mitral valve is normal in structure. Mild mitral annular calcification. Mild mitral valve regurgitation. No evidence of mitral valve stenosis by observation. Tricuspid Valve: The tricuspid valve is normal in structure. Tricuspid valve regurgitation is mild. Aortic Valve: The aortic valve is normal in structure. Aortic valve regurgitation is not visualized. The aortic valve is structurally normal, with no evidence of sclerosis or stenosis. Pulmonic Valve: The pulmonic valve was normal in structure. Pulmonic valve regurgitation is not visualized. Pulmonic regurgitation is not visualized. Aorta: The aortic root, ascending aorta and aortic arch are all structurally normal, with no evidence of dilitation or obstruction. Venous: The inferior vena cava is normal in size with greater than 50% respiratory variability, suggesting right atrial pressure of 3 mmHg. IAS/Shunts: No atrial level shunt detected by color flow Doppler. Agitated saline contrast was given intravenously to evaluate for intracardiac shunting.  Saline contrast bubble study was negative, with no evidence of any interatrial shunt. There is no evidence of a patent foramen ovale. No ventricular septal defect is seen or detected. There is no evidence of an atrial septal defect. Additional Comments: No intracardiac thrombi or masses were visualized.  LEFT VENTRICLE PLAX 2D LVIDd:         4.30 cm  Diastology LVIDs:         3.20 cm  LV e' lateral:   9.14 cm/s LV PW:         1.10 cm  LV E/e' lateral: 8.9 LV IVS:        1.10 cm  LV e' medial:    5.33 cm/s LVOT diam:     2.20 cm  LV E/e' medial:  15.3 LV SV:         42 ml LV SV Index:   20.10 LVOT Area:     3.80 cm  RIGHT VENTRICLE RV S prime:     15.30 cm/s TAPSE (M-mode): 2.8 cm LEFT ATRIUM             Index       RIGHT ATRIUM           Index LA diam:        4.90 cm 2.46 cm/m  RA Area:     18.40 cm LA Vol (A2C):   94.5 ml 47.41 ml/m  RA Volume:   48.90 ml  24.53 ml/m LA Vol (A4C):   60.3 ml 30.25 ml/m LA Biplane Vol: 75.6 ml 37.93 ml/m  AORTIC VALVE LVOT Vmax:   91.70 cm/s LVOT Vmean:  57.200 cm/s LVOT VTI:    0.213 m  AORTA Ao Root diam: 2.90 cm MITRAL VALVE                        TRICUSPID VALVE MV Area (PHT): 2.80 cm             TR Peak grad:   29.2 mmHg MV PHT:        78.59 msec           TR Vmax:        270.00 cm/s MV Decel Time: 271 msec MR Peak grad: 88.0 mmHg             SHUNTS MR Vmax:      469.00 cm/s           Systemic VTI:  0.21 m MV E velocity: 81.80 cm/s 103 cm/s  Systemic Diam: 2.20 cm MV A velocity: 81.80 cm/s 70.3 cm/s MV E/A ratio:  1.00       1.5  Donato Schultz MD Electronically signed by Donato Schultz MD Signature Date/Time: 12/08/2019/4:26:09 PM    Final    carotid doppler/carotid US  Result Date: 12/09/2019 Carotid Arterial Duplex Study Indications:       CVA. Risk Factors:      Prior CVA. Limitations        Today's exam was limited due to the patient's inability or                    unwillingness to cooperate. Comparison Study:  no prior Performing Technologist: Blanch Media RVS   Examination Guidelines: A complete evaluation includes B-mode imaging, spectral Doppler, color Doppler, and power Doppler as needed of all accessible portions of each vessel. Bilateral testing is considered an integral part of a complete examination. Limited examinations for reoccurring indications may be performed as noted.  Right Carotid Findings: +----------+--------+--------+--------+------------------+--------+           PSV cm/sEDV cm/sStenosisPlaque DescriptionComments +----------+--------+--------+--------+------------------+--------+ CCA Prox  40      7               heterogenous               +----------+--------+--------+--------+------------------+--------+ CCA Distal45      7               heterogenous               +----------+--------+--------+--------+------------------+--------+ ICA Prox  50      12      1-39%   heterogenous               +----------+--------+--------+--------+------------------+--------+ ICA Distal45      7                                          +----------+--------+--------+--------+------------------+--------+ ECA       77      6                                          +----------+--------+--------+--------+------------------+--------+ +----------+--------+-------+--------+-------------------+  PSV cm/sEDV cmsDescribeArm Pressure (mmHG) +----------+--------+-------+--------+-------------------+ Subclavian79                                         +----------+--------+-------+--------+-------------------+ +---------+--------+--+--------+-+---------+ VertebralPSV cm/s55EDV cm/s7Antegrade +---------+--------+--+--------+-+---------+  Left Carotid Findings: +----------+--------+--------+--------+------------------+--------------+           PSV cm/sEDV cm/sStenosisPlaque DescriptionComments       +----------+--------+--------+--------+------------------+--------------+ CCA Prox  45      8                heterogenous                     +----------+--------+--------+--------+------------------+--------------+ CCA Distal47      6               heterogenous                     +----------+--------+--------+--------+------------------+--------------+ ICA Prox  63      11      1-39%   heterogenous                     +----------+--------+--------+--------+------------------+--------------+ ICA Distal                                          Not visualized +----------+--------+--------+--------+------------------+--------------+ ECA       132     7                                                +----------+--------+--------+--------+------------------+--------------+ +----------+--------+--------+--------+-------------------+           PSV cm/sEDV cm/sDescribeArm Pressure (mmHG) +----------+--------+--------+--------+-------------------+ [id number]                                          +----------+--------+--------+--------+-------------------+ +---------+--------+--+--------+-+---------+ VertebralPSV cm/s19EDV cm/s5Antegrade +---------+--------+--+--------+-+---------+  Summary: Right Carotid: Velocities in the right ICA are consistent with a 1-39% stenosis. Left Carotid: Velocities in the left ICA are consistent with a 1-39% stenosis. Vertebrals: Bilateral vertebral arteries demonstrate antegrade flow. *See table(s) above for measurements and observations.  Electronically signed by Antony Contras MD on 12/09/2019 at 12:02:15 PM.    Final    VAS [place] TRANSCRANIAL DOPPLER  Result Date: 12/10/2019  Transcranial Doppler Indications: Stroke. Limitations: poor windows Limitations for diagnostic windows: Unable to insonate right transtemporal window. Unable to insonate left transtemporal window. Unable to insonate occipital window. Performing Technologist: June Leap RDMS, RVT  Examination Guidelines: A complete evaluation includes B-mode imaging, spectral Doppler,  color Doppler, and power Doppler as needed of all accessible portions of each vessel. Bilateral testing is considered an integral part of a complete examination. Limited examinations for reoccurring indications may be performed as noted.  +----------+-------------+----------+-----------+-------+ RIGHT TCD Right VM (cm)Depth (cm)PulsatilityComment +----------+-------------+----------+-----------+-------+ Opthalmic     24.00                 1.41            +----------+-------------+----------+-----------+-------+ ICA siphon    43.00                 1.06            +----------+-------------+----------+-----------+-------+  +----------+------------+----------+-----------+-------+  LEFT TCD  Left VM (cm)Depth (cm)PulsatilityComment +----------+------------+----------+-----------+-------+ Opthalmic    25.00                 1.26            +----------+------------+----------+-----------+-------+ ICA siphon   -62.00                1.22            +----------+------------+----------+-----------+-------+  Summary:  suboptimal study due to absent bitemporal and suboccipital windows. Nomral blood flow directions and mean flow velocities in both opthalics and carotid siphons. *See table(s) above for measurements and observations.  Diagnosing physician: Delia Heady MD Electronically signed by Delia Heady MD on 12/10/2019 at 12:12:27 PM.    Final      Scheduled Meds: . ampicillin  500 mg Oral Q6H  . [START ON 12/11/2019] aspirin  325 mg Per Tube Daily  . chlorhexidine  15 mL Mouth Rinse BID  . clopidogrel  75 mg Per Tube Daily  . heparin  5,000 Units Subcutaneous Q12H  . [START ON 12/11/2019] levothyroxine  125 mcg Oral Q0600  . lisinopril  20 mg Per Tube Daily  . mouth rinse  15 mL Mouth Rinse q12n4p  . polyethylene glycol  17 g Per Tube Daily  . senna-docusate  2 tablet Per Tube BID  . simvastatin  20 mg Per Tube QPM   Continuous Infusions: . feeding supplement (JEVITY 1.2  CAL) 55 mL/hr at 12/10/19 0900     LOS: 5 days    Time spent: 25 minutes spent in the coordination of care today.    Teddy Spike, DO [facility] Pager [phone]  If 7PM-7AM, please contact night-coverage [email or web address] Password [email or web address] 12/10/2019, 2:17 PM

## 2019-12-10 NOTE — Care Management Important Message (Signed)
Important Message  Patient Details  Name: Jillian Gay MRN: [id number] Date of Birth: 1932-03-14   Medicare Important Message Given:  Yes     Shelda Altes 12/10/2019, 1:06 PM

## 2019-12-10 NOTE — Progress Notes (Signed)
Inpatient Rehabilitation-Admissions Coordinator   Noted PT and SLP recommendations are for SNF. Pt not following commands and remains limited in active participation. Feel SNF is most appropriate placement for this patient. Will contact pt's husband and TOC team regarding recommendations.   AC will sign off.   Please call if questions.   Raechel Ache, OTR/L  Rehab Admissions Coordinator  [phone] 12/10/2019 1:00 PM

## 2019-12-10 NOTE — Progress Notes (Signed)
  Speech Language Pathology Treatment: Dysphagia  Patient Details Name: Jillian Gay MRN: [id number] DOB: [birth year] Today's Date: 12/10/2019 Time: 1005-1020 SLP Time Calculation (min) (ACUTE ONLY): 15 min  Assessment / Plan / Recommendation Clinical Impression  Pt provided aggressive oral care prior to PO trials; non-volitional swallow not noted during oral care and limited oral motor movement; salivary pooling on left noted during oral care; pt was able to state name last tx session and no speaking noted this session.  Ice chips given with limited bolus manipulation/propulsion and delayed initiation of swallow and delayed throat clearing/audible swallow noted during consumption.  Recommend continue NPO status with nutrition/hydration provided non-orally; ST will continue to f/u for PO readiness/dysphagia tx while in acute setting.   HPI HPI: Jillian Gay is a 85 y.o. female with medical history significant of stroke with residual right-sided weakness, aphasia, slurred speech, hypertension hyperlipidemia, thyroidism, presented with weakness for 7 days and change in mental status for 2 days.   Granddaughter reported choking event piror to admission.   Head CT on 12/18 reported "No acute intracranial abnormality evident by CT."  MRI results on 12/08/19 indicated Moderate-sized acute and subacute right ACA infarcts  CXR on 12/18 reported "Low lung volumes with streaky bibasilar opacities, likely atelectasis". 12/06/19 MBS results indicated moderate oropharyngeal dysphagia and recommend NPO with frequent oral care and consideration of short-term alternative means of nutrition.  Pt may have a few small ice chips or small spoon sips of thin liquid following thorough oral care to mobilize oropharyngeal swallow musculature and to help keep oral cavity moistened; Cortrak placed for nutrition/hydration since last tx session.       SLP Plan  Continue with current plan of care       Recommendations   Diet recommendations: NPO Medication Administration: Via alternative means                Oral Care Recommendations: Oral care QID;Staff/trained caregiver to provide oral care Follow up Recommendations: Skilled Nursing facility;24 hour supervision/assistance SLP Visit Diagnosis: Dysphagia, oropharyngeal phase (R13.12) Plan: Continue with current plan of care                       Elvina Sidle, M.S., CCC-SLP 12/10/2019, 10:52 AM

## 2019-12-10 NOTE — Progress Notes (Signed)
STROKE TEAM PROGRESS NOTE     INTERVAL HISTORY   Pt remains abulic and chronically debilitated. She did not participate in exam and did not speak.   t  OBJECTIVE Vitals:   12/10/19 0121 12/10/19 0407 12/10/19 0837 12/10/19 1121  BP: (!) 120/54 137/65 (!) 137/56 (!) 157/56  Pulse: 69 76  (!) 58  Resp: 16 19    Temp:  98.9 F (37.2 C)  97.6 F (36.4 C)  TempSrc:  Axillary  Oral  SpO2: 93% 99%  97%  Weight:  92.4 kg    Height:        CBC:  Recent Labs  Lab 12/05/19 1007 12/05/19 1017 12/09/19 0358 12/10/19 0945  WBC 6.3   < > 5.2 4.5  NEUTROABS 4.6  --   --   --   HGB 12.2  --  11.9* 12.4  HCT 38.4  --  36.2 37.6  MCV 103.2*   < > 99.2 99.7  PLT 214   < > 251 206   < > = values in this interval not displayed.    Basic Metabolic Panel:  Recent Labs  Lab 12/07/19 0342 12/08/19 0322 12/09/19 0358 12/10/19 0945  NA 144 145 140 145  K 3.6 3.2* 3.3* 4.0  CL 107 107 106 111  CO2 27 28 24 24   GLUCOSE 122* 131* 131* 155*  BUN 12 11 11 14   CREATININE 0.81 0.66 0.76 0.71  CALCIUM 8.7* 8.7* 8.7* 9.0  MG 2.1 2.0  --   --     Lipid Panel:     Component Value Date/Time   CHOL 131 12/06/2019 0414   TRIG 186 (H) 12/06/2019 0414   HDL 40 (L) 12/06/2019 0414   CHOLHDL 3.3 12/06/2019 0414   VLDL 37 12/06/2019 0414   LDLCALC 54 12/06/2019 0414   HgbA1c:  Lab Results  Component Value Date   HGBA1C 5.8 (H) 12/09/2019   Urine Drug Screen: No results found for: LABOPIA, COCAINSCRNUR, LABBENZ, AMPHETMU, THCU, LABBARB  Alcohol Level No results found for: [facility]  IMAGING   ECHOCARDIOGRAM COMPLETE BUBBLE STUDY  Result Date: 12/08/2019   ECHOCARDIOGRAM REPORT   Patient Name:   Jillian Gay Date of Exam: 12/08/2019 Medical Rec #:  [id number]       Height:       65.0 in Accession #:    [id number]      Weight:       203.6 lb Date of Birth:  [birth year]       BSA:          1.99 m Patient Age:    87 years        BP:           160/61 mmHg Patient Gender: F               HR:            64 bpm. Exam Location:  Inpatient Procedure: 2D Echo and Saline Contrast Bubble Study Indications:    Stroke I63.9  History:        Patient has no prior history of Echocardiogram examinations.                 Risk Factors:Hypertension.  Sonographer:    Thurman Coyerasey Kirkpatrick RDCS (AE) Referring Phys: [id number] CAROLE N HALL IMPRESSIONS  1. Left ventricular ejection fraction, by visual estimation, is 60 to 65%. The left ventricle has normal function. There is no left ventricular hypertrophy.  2. Left ventricular  diastolic parameters are consistent with Grade I diastolic dysfunction (impaired relaxation).  3. The left ventricle has no regional wall motion abnormalities.  4. Global right ventricle has normal systolic function.The right ventricular size is normal. No increase in right ventricular wall thickness.  5. Left atrial size was moderately dilated.  6. Right atrial size was normal.  7. Mild mitral annular calcification.  8. The mitral valve is normal in structure. Mild mitral valve regurgitation. No evidence of mitral stenosis.  9. The tricuspid valve is normal in structure. Tricuspid valve regurgitation is mild. 10. The aortic valve is normal in structure. Aortic valve regurgitation is not visualized. No evidence of aortic valve sclerosis or stenosis. 11. The pulmonic valve was normal in structure. Pulmonic valve regurgitation is not visualized. 12. Mildly elevated pulmonary artery systolic pressure. 13. The inferior vena cava is normal in size with greater than 50% respiratory variability, suggesting right atrial pressure of 3 mmHg. 14. No intracardiac thrombi or masses were visualized. FINDINGS  Left Ventricle: Left ventricular ejection fraction, by visual estimation, is 60 to 65%. The left ventricle has normal function. The left ventricle has no regional wall motion abnormalities. There is no left ventricular hypertrophy. Left ventricular diastolic parameters are consistent with Grade I diastolic dysfunction  (impaired relaxation). Normal left atrial pressure. Right Ventricle: The right ventricular size is normal. No increase in right ventricular wall thickness. Global RV systolic function is has normal systolic function. The tricuspid regurgitant velocity is 2.70 m/s, and with an assumed right atrial pressure  of 8 mmHg, the estimated right ventricular systolic pressure is mildly elevated at 37.2 mmHg. Left Atrium: Left atrial size was moderately dilated. Right Atrium: Right atrial size was normal in size Pericardium: There is no evidence of pericardial effusion. Mitral Valve: The mitral valve is normal in structure. Mild mitral annular calcification. Mild mitral valve regurgitation. No evidence of mitral valve stenosis by observation. Tricuspid Valve: The tricuspid valve is normal in structure. Tricuspid valve regurgitation is mild. Aortic Valve: The aortic valve is normal in structure. Aortic valve regurgitation is not visualized. The aortic valve is structurally normal, with no evidence of sclerosis or stenosis. Pulmonic Valve: The pulmonic valve was normal in structure. Pulmonic valve regurgitation is not visualized. Pulmonic regurgitation is not visualized. Aorta: The aortic root, ascending aorta and aortic arch are all structurally normal, with no evidence of dilitation or obstruction. Venous: The inferior vena cava is normal in size with greater than 50% respiratory variability, suggesting right atrial pressure of 3 mmHg. IAS/Shunts: No atrial level shunt detected by color flow Doppler. Agitated saline contrast was given intravenously to evaluate for intracardiac shunting. Saline contrast bubble study was negative, with no evidence of any interatrial shunt. There is no evidence of a patent foramen ovale. No ventricular septal defect is seen or detected. There is no evidence of an atrial septal defect. Additional Comments: No intracardiac thrombi or masses were visualized.  LEFT VENTRICLE PLAX 2D LVIDd:          4.30 cm  Diastology LVIDs:         3.20 cm  LV e' lateral:   9.14 cm/s LV PW:         1.10 cm  LV E/e' lateral: 8.9 LV IVS:        1.10 cm  LV e' medial:    5.33 cm/s LVOT diam:     2.20 cm  LV E/e' medial:  15.3 LV SV:         42  ml LV SV Index:   20.10 LVOT Area:     3.80 cm  RIGHT VENTRICLE RV S prime:     15.30 cm/s TAPSE (M-mode): 2.8 cm LEFT ATRIUM             Index       RIGHT ATRIUM           Index LA diam:        4.90 cm 2.46 cm/m  RA Area:     18.40 cm LA Vol (A2C):   94.5 ml 47.41 ml/m RA Volume:   48.90 ml  24.53 ml/m LA Vol (A4C):   60.3 ml 30.25 ml/m LA Biplane Vol: 75.6 ml 37.93 ml/m  AORTIC VALVE LVOT Vmax:   91.70 cm/s LVOT Vmean:  57.200 cm/s LVOT VTI:    0.213 m  AORTA Ao Root diam: 2.90 cm MITRAL VALVE                        TRICUSPID VALVE MV Area (PHT): 2.80 cm             TR Peak grad:   29.2 mmHg MV PHT:        78.59 msec           TR Vmax:        270.00 cm/s MV Decel Time: 271 msec MR Peak grad: 88.0 mmHg             SHUNTS MR Vmax:      469.00 cm/s           Systemic VTI:  0.21 m MV E velocity: 81.80 cm/s 103 cm/s  Systemic Diam: 2.20 cm MV A velocity: 81.80 cm/s 70.3 cm/s MV E/A ratio:  1.00       1.5  Donato Schultz MD Electronically signed by Donato Schultz MD Signature Date/Time: 12/08/2019/4:26:09 PM    Final    VAS [place] TRANSCRANIAL DOPPLER  Result Date: 12/10/2019  Transcranial Doppler Indications: Stroke. Limitations: poor windows Limitations for diagnostic windows: Unable to insonate right transtemporal window. Unable to insonate left transtemporal window. Unable to insonate occipital window. Performing Technologist: Jeb Levering RDMS, RVT  Examination Guidelines: A complete evaluation includes B-mode imaging, spectral Doppler, color Doppler, and power Doppler as needed of all accessible portions of each vessel. Bilateral testing is considered an integral part of a complete examination. Limited examinations for reoccurring indications may be performed as noted.   +----------+-------------+----------+-----------+-------+ RIGHT TCD Right VM (cm)Depth (cm)PulsatilityComment +----------+-------------+----------+-----------+-------+ Opthalmic     24.00                 1.41            +----------+-------------+----------+-----------+-------+ ICA siphon    43.00                 1.06            +----------+-------------+----------+-----------+-------+  +----------+------------+----------+-----------+-------+ LEFT TCD  Left VM (cm)Depth (cm)PulsatilityComment +----------+------------+----------+-----------+-------+ Opthalmic    25.00                 1.26            +----------+------------+----------+-----------+-------+ ICA siphon   -62.00                1.22            +----------+------------+----------+-----------+-------+  Summary:  suboptimal study due to absent bitemporal and suboccipital windows. Nomral blood flow directions and mean flow velocities in both opthalics  and carotid siphons. *See table(s) above for measurements and observations.  Diagnosing physician: Delia Heady MD Electronically signed by Delia Heady MD on 12/10/2019 at 12:12:27 PM.    Final    EEG This is an abnormal electroencephalogram secondary to background slowing with occasional triphasic waves consistent with an encephalopathy of nonspecific etiology.    PHYSICAL EXAM Blood pressure (!) 157/56, pulse (!) 58, temperature 97.6 F (36.4 C), temperature source Oral, resp. rate 19, height  (1.651 m), weight 92.4 kg, SpO2 97 %. GENERAL: Awake, alert in NAD HENT: - Normocephalic and atraumatic, dry mm, Eyes: normal conjunctiva LUNGS - Clear/diminished to auscultation bilaterally with no wheezes CV - S1S2 RRR, no m/r/g, equal pulses bilaterally. ABDOMEN - Soft, nontender, nondistended with normoactive BS Ext: warm, well perfused, intact peripheral pulses, no  Edema, Bilateral lower extremities wrapped in ace wrap PSYCH: calm   NEURO:  Mental Status:  Awake, eyes open.  She does follow gaze but does not follow commands consistently.  She will blink to threat on both sides patient is nonverbal and does not follow commands. No attempts at speaking.  Language: Patient is mute and makes only few guttural sounds.  Suspect severe pseudobulbar state with possible some abulia from the current ACA stroke c Cranial Nerves: PERRL 61mm/sluggish. Tracks briefly, visual fields full to threat,right facial asymmetry, facial sensation intact,.  Diminished facial expression bilaterally hearing intact, tongue/uvula/soft palate midline, normal sternocleidomastoid and trapezius muscle strength. No evidence of tongue atrophy or fibrillations.  Jaw jerk is brisk.  Palmomental and grasp reflexes are present bilaterally Motor: Right upper extremity able to hold up but drifts to bed.  Left upper holds up with slight drift.  Right hand is contracted.  Did not move bilateral legs when told but did to noxious stimuli Tone: is increased bilaterally right more than left l and bulk is normal Sensation- Intact to noxious stimuli Coordination: unable to test Gait- deferred        HOSPITAL MEDICATIONS:  . ampicillin  500 mg Oral Q6H  . [START ON 12/11/2019] aspirin  325 mg Per Tube Daily  . chlorhexidine  15 mL Mouth Rinse BID  . clopidogrel  75 mg Per Tube Daily  . heparin  5,000 Units Subcutaneous Q12H  . [START ON 12/11/2019] levothyroxine  125 mcg Oral Q0600  . lisinopril  20 mg Per Tube Daily  . mouth rinse  15 mL Mouth Rinse q12n4p  . polyethylene glycol  17 g Per Tube Daily  . senna-docusate  2 tablet Per Tube BID  . simvastatin  20 mg Per Tube QPM    ALLERGIES Allergies  Allergen Reactions  . Celebrex [Celecoxib]   . Macrodantin   . Metoprolol   . Norvasc [Amlodipine Besylate]    ASSESSMENT/PLAN Jillian Gay is a 85 y.o. female who is very debilitated and bedbound at baseline with past medical history of HTN, HLD, hypothyroidism and prior strokes  with deficits, vascular dementia. Per chart, she has progressively became weaker, developed confusion. UTI was found and treated.   Stroke: Acute/subacute Right ACA infarcts  Resultant abulic state with severe pseudobulbar affect -difficult to discern new deficits d/t extensive underlying debility and prev stroke.   Code Stroke CT Head -    ASPECTSunable  CT head - multiple old infarcts bilaterally. No acute findings  MRI head-Acute/subacute Right ACA infarcts. Progressive severe chronic small vessel ischemic disease and numerous chronic infarcts and microhemorrhages.  MRA head - unable to visualize the SCA  CTA  H&N - not done  CT Perfusion- not done  Carotid Doppler -bilat ICA 1-39%  2D Echo -normal ejection fraction.  No cardiac source of embolism.    Transcranial Doppler study is suboptimal due to absent bitemporal and suboccipital windows.  Normal blood flow velocities in both ophthalmic's and carotid siphons.  Sars Corona Virus 2  neg  LDL - 54    Component Value Date/Time   LDLCALC 54 12/06/2019 0414    HgbA1c - 5.8  UDS not done  VTE prophylaxis - heparin sq Diet  Diet Order            Diet NPO time specified  Diet effective now              Plavix prior to admission, now on Plavix  Unable to be counseled to be compliant with her antithrombotic medications, d/t baseline dementis  Ongoing aggressive stroke risk factor management  Therapy recommendations:  pending  Disposition:  Pending  Hypertension  Home BP meds: Lasix, zestril, zocor,   Current BP meds: Zestril, apresoline, vasotec  Stable . No role in Permissive hypertension at this time d/t subacute finding . Long-term BP goal normotensive  Hyperlipidemia  Home Lipid lowering medication: simvastatin  daily  LDL 54, goal < 70  Current lipid lowering medication: Simvastatin  daily  Continue statin at discharge  Diabetes  Home diabetic meds: none   Current diabetic meds:  SSI   HgbA1c 6.4, goal < 7.0 Recent Labs    12/10/19 0412 12/10/19 0748 12/10/19 1118  GLUCAP 129* 134* 140*    Other Stroke Risk Factors  Advanced age  Obesity, Body mass index is 33.9 kg/m., recommend weight loss, diet and exercise as appropriate   Hx stroke/TIA  Coronary artery disease  Other Active Problems  UTI with AMS- being tx with Abx  Baseline vascular dementia  Severe debility at baseline- pt has not ambulated in over 68yrs (mRS 5)  Recommend Palliative Care consult for clear GOC. Do not think she will be able to participate in rehab efforts at this time  Hospital day # 5  She has presented with altered mental status and confusion in setting of UTI with a new right anterior cerebral artery infarct with background MRI showing multiple old infarcts and extensive changes of small vessel disease.  Her neurological exam is consistent with severe pseudobulbar state and abulia and baseline vascular dementia.  She is not a candidate for aggressive stroke work-up or treatment.  Recommend continue aspirin and Plavix and ongoing therapies.  Treat UTI as per primary team.  Discussed with Dr. Ronaldo Miyamoto and answered questions.  Stroke team will sign off.  Kindly call for questions. Delia Heady, MD Medical Director [facility] Pager: [phone] 12/10/2019 3:47 PM  To contact Stroke Continuity provider, please refer to [email or web address]. After hours, contact General Neurology

## 2019-12-10 NOTE — Progress Notes (Signed)
PHARMACIST - PHYSICIAN COMMUNICATION   CONCERNING: IV to Oral Route Change Policy  RECOMMENDATION: This patient is receiving synthroid by the intravenous route.  Based on criteria approved by the Pharmacy and Therapeutics Committee, the intravenous medication(s) is/are being converted to the equivalent oral dose form(s).   DESCRIPTION: These criteria include:  The patient is eating (either orally or via tube) and/or has been taking other orally administered medications for a least 24 hours  The patient has no evidence of active gastrointestinal bleeding or impaired GI absorption (gastrectomy, short bowel, patient on TNA or NPO).  If you have questions about this conversion, please contact the Pharmacy Department  []  ( [phone] )  [place] []  ( [phone]   [facility] [x]  ( [phone] )  [place] []  ( [phone]  [facility] []  ( [phone] )  [facility]   Kabir Brannock, PharmD Clinical Pharmacist **Pharmacist phone directory can now be found on [email or web address] ([email or web address]).  Listed under [facility].    

## 2019-12-11 DIAGNOSIS — B952 Enterococcus as the cause of diseases classified elsewhere: Secondary | ICD-10-CM

## 2019-12-11 DIAGNOSIS — R1310 Dysphagia, unspecified: Secondary | ICD-10-CM

## 2019-12-11 DIAGNOSIS — S22080S Wedge compression fracture of T11-T12 vertebra, sequela: Secondary | ICD-10-CM

## 2019-12-11 DIAGNOSIS — N390 Urinary tract infection, site not specified: Secondary | ICD-10-CM

## 2019-12-11 LAB — GLUCOSE, CAPILLARY
Glucose-Capillary: 148 mg/dL — ABNORMAL HIGH (ref 70–99)
Glucose-Capillary: 157 mg/dL — ABNORMAL HIGH (ref 70–99)
Glucose-Capillary: 142 mg/dL — ABNORMAL HIGH (ref 70–99)

## 2019-12-11 LAB — CBC
HCT: 36.9 % (ref 36.0–46.0)
Hemoglobin: 11.8 g/dL — ABNORMAL LOW (ref 12.0–15.0)
MCH: 32.2 pg (ref 26.0–34.0)
MCHC: 32 g/dL (ref 30.0–36.0)
MCV: 100.8 fL — ABNORMAL HIGH (ref 80.0–100.0)
Platelets: 228 10*3/uL (ref 150–400)
RBC: 3.66 MIL/uL — ABNORMAL LOW (ref 3.87–5.11)
RDW: 14.5 % (ref 11.5–15.5)
WBC: 4.6 10*3/uL (ref 4.0–10.5)
nRBC: 0 % (ref 0.0–0.2)

## 2019-12-11 LAB — BASIC METABOLIC PANEL
Anion gap: 9 (ref 5–15)
BUN: 16 mg/dL (ref 8–23)
CO2: 26 mmol/L (ref 22–32)
Calcium: 8.9 mg/dL (ref 8.9–10.3)
Chloride: 111 mmol/L (ref 98–111)
Creatinine, Ser: 0.73 mg/dL (ref 0.44–1.00)
GFR calc Af Amer: >60 mL/min (ref >60)
GFR calc non Af Amer: >60 mL/min (ref >60)
Glucose, Bld: 159 mg/dL — ABNORMAL HIGH (ref 70–99)
Potassium: 4.1 mmol/L (ref 3.5–5.1)
Sodium: 146 mmol/L — ABNORMAL HIGH (ref 135–145)

## 2019-12-11 MED ORDER — LISINOPRIL 40 MG PO TABS
40.0000 mg | ORAL_TABLET | Freq: Every day | ORAL | Status: DC
  Administered 2019-12-12 – 2019-12-15 (×4): 40 mg
  Filled 2019-12-11 (×4): qty 1

## 2019-12-11 NOTE — Progress Notes (Addendum)
Jillian Gay  PROGRESS NOTE    Jillian Gay  XIP:[id number] DOB: [birth year] DOA: 12/05/2019 PCP: Geoffry Paradise, MD   Brief Narrative:   85 year old female with past medical history significant for CVA with residual right-sided weakness, slurred speech, hypertension, hyperlipidemia, hypothyroidism, who presented with change in mental status, unable to recognize family members since yesterday. Concern for new CVA, CT head done negative for any acute intracranial findings, showed old CVA. MRI brain ordered. Admitted for AMS/CVA work-up. MRI brain completed 12/21 and showed acute/subacute right ACA stroke. She had EEG slowing with occasional triphasic waves.  12/11/19: Increase lisinopril. Consult PC for goal of care  Spoke with husband about PEG vs CC/hospice. I think he would be in favor of a more comfort care roll. PC consulted.   Assessment & Plan:   Active Problems:   Altered mental state   AMS (altered mental status)   Cerebral thrombosis with cerebral infarction  Acute/subacute right ACA CVA in the setting of prior CVA.     - positive MRI brain 12/08/2019.     - LDL 54, goal less than 70; continue simvastatin 20 mg daily     - A1c 6.3; goal less than 7.0     - PT/OT rec CIR; Inpt Rehab eval'd, feel she is more appropriate for SNF     - SLP rec n.p.o; Cortrak placed 12/21; need permanent solution, will d/w family     - 2D echo with bubble study and bilateral duplex ultrasound no evidence of PFO or thrombus.           - Normal LVEF with grade 1 diastolic dysfunction.     - Carotid ultrasound unremarkable     - Continue plavix, aspirin 81 mg daily as recommended by neurology stroke team     - 12/11/19: attempting to talk to family; unable to reach husband; spoke with dtr, deferring to husband at this time; need permanent solution for feeds/nutrition  Acute metabolic encephalopathy, likely secondary to acute/subacute right ACA CVA.     - CT head showed old CVA with no acute  intracranial findings     - elevated TSH 6, free T4 normal     - Continue home levothyroxine, switched to IV due to NPO  T12 compression fracture     - Seen on MRI spine.     - Pain management in place.  Enterococcus faecalis UTI     - WBC UA 11-20>> UCX grew 30,000 Enterococcus faecalis, pansensitive.     - ampicillin; end today  History of CVA with right sided weakness     - Continue PT OT with assistance and fall precautions     - Recommended CIR placement; Inpt Rehab eval'd, feel she is more appropriate for SNF  Dysphagia     - SLP rec NPO     - Cortrak placement 12/21; need permanent solution  Subclinical hypothyroidism     - TSH elevated 6     - Free T4 normal     - Continue levothyroxine, switched to IV due to NPO  Essential hypertension     - lisinopril; increase to 40mg   Hypokalemia     - resolved  DVT prophylaxis: heparin Code Status: DNR Disposition Plan: TBD  Consultants:   Neurology  Antimicrobials:  . ampicillin   Subjective: No acute events ON.   Objective: Vitals:   12/11/19 0246 12/11/19 0421 12/11/19 0959 12/11/19 1400  BP: (!) 141/52 (!) 159/59 (!) 173/79 (!) 163/60  Pulse: 63 61  (!)  57  Resp: 17 17  18   Temp:  99.1 F (37.3 C)    TempSrc:  Axillary    SpO2: 97% 99%  95%  Weight:  94.1 kg    Height:        Intake/Output Summary (Last 24 hours) at 12/11/2019 1416 Last data filed at 12/11/2019 0650 Gross per 24 hour  Intake -  Output 550 ml  Net -550 ml   Filed Weights   12/09/19 0429 12/10/19 0407 12/11/19 0421  Weight: 92.9 kg 92.4 kg 94.1 kg    Examination:  General: 85 y.o. female resting in bed in NAD Cardiovascular: RRR, +S1, S2, no m/g/r Respiratory: CTABL, no w/r/r GI: BS+, NDNT, cortrak in place MSK: No e/c/c Neuro: alert and somewhat active, but not truly following commands   Data Reviewed: I have personally reviewed following labs and imaging studies.  CBC: Recent Labs  Lab 12/05/19 1007  12/05/19 1017 12/08/19 0322 12/09/19 0358 12/10/19 0945 12/11/19 0350  WBC 6.3  --  5.7 5.2 4.5 4.6  NEUTROABS 4.6  --   --   --   --   --   HGB 12.2 11.9* 12.4 11.9* 12.4 11.8*  HCT 38.4 35.0* 38.1 36.2 37.6 36.9  MCV 103.2*  --  100.0 99.2 99.7 100.8*  PLT 214  --  242 251 206 228   Basic Metabolic Panel: Recent Labs  Lab 12/07/19 0342 12/08/19 0322 12/09/19 0358 12/10/19 0945 12/11/19 0350  NA 144 145 140 145 146*  K 3.6 3.2* 3.3* 4.0 4.1  CL 107 107 106 111 111  CO2 27 28 24 24 26   GLUCOSE 122* 131* 131* 155* 159*  BUN 12 11 11 14 16   CREATININE 0.81 0.66 0.76 0.71 0.73  CALCIUM 8.7* 8.7* 8.7* 9.0 8.9  MG 2.1 2.0  --   --   --    GFR: Estimated Creatinine Clearance: 58.3 mL/min (by C-G formula based on SCr of 0.73 mg/dL). Liver Function Tests: Recent Labs  Lab 12/05/19 1007  AST 26  ALT 23  ALKPHOS 44  BILITOT 0.8  PROT 5.1*  ALBUMIN 2.8*   No results for input(s): LIPASE, AMYLASE in the last 168 hours. Recent Labs  Lab 12/06/19 1823  AMMONIA 13   Coagulation Profile: No results for input(s): INR, PROTIME in the last 168 hours. Cardiac Enzymes: No results for input(s): CKTOTAL, CKMB, CKMBINDEX, TROPONINI in the last 168 hours. BNP (last 3 results) No results for input(s): PROBNP in the last 8760 hours. HbA1C: Recent Labs    12/09/19 0358  HGBA1C 5.8*   CBG: Recent Labs  Lab 12/10/19 1627 12/10/19 1958 12/10/19 2357 12/11/19 0415 12/11/19 0747  GLUCAP 135* 136* 140* 148* 157*   Lipid Profile: No results for input(s): CHOL, HDL, LDLCALC, TRIG, CHOLHDL, LDLDIRECT in the last 72 hours. Thyroid Function Tests: No results for input(s): TSH, T4TOTAL, FREET4, T3FREE, THYROIDAB in the last 72 hours. Anemia Panel: No results for input(s): VITAMINB12, FOLATE, FERRITIN, TIBC, IRON, RETICCTPCT in the last 72 hours. Sepsis Labs: No results for input(s): PROCALCITON, LATICACIDVEN in the last 168 hours.  Recent Results (from the past 240 hour(s))   SARS CORONAVIRUS 2 (TAT 6-24 HRS) Nasopharyngeal Nasopharyngeal Swab     Status: None   Collection Time: 12/05/19 10:07 AM   Specimen: Nasopharyngeal Swab  Result Value Ref Range Status   SARS Coronavirus 2 NEGATIVE NEGATIVE Final    Comment: (NOTE) SARS-CoV-2 target nucleic acids are NOT DETECTED. The SARS-CoV-2 RNA is generally detectable  in upper and lower respiratory specimens during the acute phase of infection. Negative results do not preclude SARS-CoV-2 infection, do not rule out co-infections with other pathogens, and should not be used as the sole basis for treatment or other patient management decisions. Negative results must be combined with clinical observations, patient history, and epidemiological information. The expected result is Negative. Fact Sheet for Patients: SugarRoll.be Fact Sheet for Healthcare Providers: https://www.woods-mathews.com/ This test is not yet approved or cleared by the [place] FDA and  has been authorized for detection and/or diagnosis of SARS-CoV-2 by FDA under an Emergency Use Authorization (EUA). This EUA will remain  in effect (meaning this test can be used) for the duration of the COVID-19 declaration under Section 56 4(b)(1) of the Act, 21 U.S.C. section 360bbb-3(b)(1), unless the authorization is terminated or revoked sooner. Performed at [facility], [place] [address], [place], [place] [address]   Culture, Urine     Status: Abnormal   Collection Time: 12/06/19 11:29 AM   Specimen: Urine, Clean Catch  Result Value Ref Range Status   Specimen Description URINE, CLEAN CATCH  Final   Special Requests   Final    Normal Performed at [facility], [place] [address] [place], [place] [address]    Culture 30,000 COLONIES/mL ENTEROCOCCUS FAECALIS (A)  Final   Report Status 12/08/2019 FINAL  Final   Organism ID, Bacteria ENTEROCOCCUS FAECALIS (A)  Final      Susceptibility    Enterococcus faecalis - MIC*    AMPICILLIN <=2 SENSITIVE Sensitive     NITROFURANTOIN <=16 SENSITIVE Sensitive     VANCOMYCIN 1 SENSITIVE Sensitive     * 30,000 COLONIES/mL ENTEROCOCCUS FAECALIS      Radiology Studies: VAS [place] TRANSCRANIAL DOPPLER  Result Date: 12/10/2019  Transcranial Doppler Indications: Stroke. Limitations: poor windows Limitations for diagnostic windows: Unable to insonate right transtemporal window. Unable to insonate left transtemporal window. Unable to insonate occipital window. Performing Technologist: June Leap RDMS, RVT  Examination Guidelines: A complete evaluation includes B-mode imaging, spectral Doppler, color Doppler, and power Doppler as needed of all accessible portions of each vessel. Bilateral testing is considered an integral part of a complete examination. Limited examinations for reoccurring indications may be performed as noted.  +----------+-------------+----------+-----------+-------+ RIGHT TCD Right VM (cm)Depth (cm)PulsatilityComment +----------+-------------+----------+-----------+-------+ Opthalmic     24.00                 1.41            +----------+-------------+----------+-----------+-------+ ICA siphon    43.00                 1.06            +----------+-------------+----------+-----------+-------+  +----------+------------+----------+-----------+-------+ LEFT TCD  Left VM (cm)Depth (cm)PulsatilityComment +----------+------------+----------+-----------+-------+ Opthalmic    25.00                 1.26            +----------+------------+----------+-----------+-------+ ICA siphon   -62.00                1.22            +----------+------------+----------+-----------+-------+  Summary:  suboptimal study due to absent bitemporal and suboccipital windows. Nomral blood flow directions and mean flow velocities in both opthalics and carotid siphons. *See table(s) above for measurements and observations.  Diagnosing  physician: Antony Contras MD Electronically signed by Antony Contras MD on 12/10/2019 at 12:12:27 PM.    Final  Scheduled Meds: . ampicillin  500 mg Per Tube Q6H  . aspirin  325 mg Per Tube Daily  . chlorhexidine  15 mL Mouth Rinse BID  . clopidogrel  75 mg Per Tube Daily  . heparin  5,000 Units Subcutaneous Q12H  . levothyroxine  125 mcg Oral Q0600  . lisinopril  20 mg Per Tube Daily  . mouth rinse  15 mL Mouth Rinse q12n4p  . polyethylene glycol  17 g Per Tube Daily  . senna-docusate  2 tablet Per Tube BID  . simvastatin  20 mg Per Tube QPM   Continuous Infusions: . feeding supplement (JEVITY 1.2 CAL) 1,000 mL (12/10/19 2013)     LOS: 6 days    Time spent: 25 minutes spent in the coordination of care today.    Teddy Spike, DO [facility]  If 7PM-7AM, please contact night-coverage [email or web address] 12/11/2019, 2:16 PM

## 2019-12-11 NOTE — Progress Notes (Signed)
Physical Therapy Treatment Patient Details Name: Jillian Gay MRN: [id number] DOB: 21-Oct-1932 Today's Date: 12/11/2019    History of Present Illness 85 yo female with onset of confusion and mental lethargy was admitted, has clear head CT, MRI shows acute and subacute R ACA infarcts.  Note acute metabolic encephalopathy, suspected UTI, now nonverbal and new non ambulatory status.  Covid (-).   PMHx:  stroke, thyroid disease, HTN, ambulatory with help,    PT Comments    Patient remains A&Ox0, non-verbal and does not interact with therapist, does not follow cues even with Max tactile and verbal stimulation. Session focus on ROM and stretching of all 4 extremities today, patient occasionally making faces of discomfort but otherwise with no interaction. Able to track therapist with eyes when PT was on her right but not when PT was on her left.  Noted likely R hand contracture vs tone, will consult OT about possible splints if they feel this is appropriate. She was left in bed with all needs met, bed alarm active this morning.    Follow Up Recommendations  SNF;Supervision/Assistance - 24 hour     Equipment Recommendations  Hospital bed;Wheelchair (measurements PT);Wheelchair cushion (measurements PT);Other (comment)(hoyer lift)    Recommendations for Other Services       Precautions / Restrictions Precautions Precautions: Fall Precaution Comments: monitor HR, O2 sat and BP's Restrictions Weight Bearing Restrictions: No    Mobility  Bed Mobility               General bed mobility comments: focus on ROM  Transfers                 General transfer comment: focus on ROM  Ambulation/Gait             General Gait Details: non ambulatory   Stairs             Wheelchair Mobility    Modified Rankin (Stroke Patients Only)       Balance                               High Level Balance Comments: focus on ROM this session             Cognition Arousal/Alertness: Lethargic Behavior During Therapy: Flat affect Overall Cognitive Status: No family/caregiver present to determine baseline cognitive functioning                                 General Comments: A&Ox0, does not follow commands, lethargic, did track PT with eyes when therapist was on her right side but not on left      Exercises      General Comments General comments (skin integrity, edema, etc.): VSS      Pertinent Vitals/Pain Pain Assessment: Faces Pain Score: 0-No pain Faces Pain Scale: No hurt Pain Intervention(s): Limited activity within patient's tolerance;Monitored during session    Home Living                      Prior Function            PT Goals (current goals can now be found in the care plan section) Acute Rehab PT Goals Patient Stated Goal: none stated, non verbal PT Goal Formulation: Patient unable to participate in goal setting Time For Goal Achievement: 12/20/19 Potential to Achieve Goals: Good Progress  towards PT goals: Progressing toward goals    Frequency    Min 2X/week      PT Plan Current plan remains appropriate    Co-evaluation              AM-PAC PT "6 Clicks" Mobility   Outcome Measure  Help needed turning from your back to your side while in a flat bed without using bedrails?: A Lot Help needed moving from lying on your back to sitting on the side of a flat bed without using bedrails?: Total Help needed moving to and from a bed to a chair (including a wheelchair)?: Total Help needed standing up from a chair using your arms (e.g., wheelchair or bedside chair)?: Total Help needed to walk in hospital room?: Total Help needed climbing 3-5 steps with a railing? : Total 6 Click Score: 7    End of Session   Activity Tolerance: Patient tolerated treatment well Patient left: with bed alarm set;in bed;with call bell/phone within reach   PT Visit Diagnosis: Unsteadiness on feet  (R26.81);Muscle weakness (generalized) (M62.81);Other abnormalities of gait and mobility (R26.89);Difficulty in walking, not elsewhere classified (R26.2)     Time: 8546-2703 PT Time Calculation (min) (ACUTE ONLY): 23 min  Charges:  $Therapeutic Exercise: 23-37 mins                     Windell Norfolk, DPT, PN1   Supplemental Physical Therapist [place]    Pager [phone] Acute Rehab Office [phone]

## 2019-12-12 DIAGNOSIS — Z7189 Other specified counseling: Secondary | ICD-10-CM

## 2019-12-12 DIAGNOSIS — Z66 Do not resuscitate: Secondary | ICD-10-CM

## 2019-12-12 LAB — GLUCOSE, CAPILLARY
Glucose-Capillary: 174 mg/dL — ABNORMAL HIGH (ref 70–99)
Glucose-Capillary: 159 mg/dL — ABNORMAL HIGH (ref 70–99)
Glucose-Capillary: 137 mg/dL — ABNORMAL HIGH (ref 70–99)
Glucose-Capillary: 159 mg/dL — ABNORMAL HIGH (ref 70–99)
Glucose-Capillary: 152 mg/dL — ABNORMAL HIGH (ref 70–99)

## 2019-12-12 LAB — RENAL FUNCTION PANEL
Albumin: 2.6 g/dL — ABNORMAL LOW (ref 3.5–5.0)
Anion gap: 12 (ref 5–15)
BUN: 18 mg/dL (ref 8–23)
CO2: 23 mmol/L (ref 22–32)
Calcium: 9.1 mg/dL (ref 8.9–10.3)
Chloride: 108 mmol/L (ref 98–111)
Creatinine, Ser: 0.78 mg/dL (ref 0.44–1.00)
GFR calc Af Amer: >60 mL/min (ref >60)
GFR calc non Af Amer: >60 mL/min (ref >60)
Glucose, Bld: 177 mg/dL — ABNORMAL HIGH (ref 70–99)
Phosphorus: 3 mg/dL (ref 2.5–4.6)
Potassium: 4.5 mmol/L (ref 3.5–5.1)
Sodium: 143 mmol/L (ref 135–145)

## 2019-12-12 LAB — CBC WITH DIFFERENTIAL/PLATELET
Abs Immature Granulocytes: 0.03 10*3/uL (ref 0.00–0.07)
Basophils Absolute: 0 10*3/uL (ref 0.0–0.1)
Basophils Relative: 0 %
Eosinophils Absolute: 0.2 10*3/uL (ref 0.0–0.5)
Eosinophils Relative: 4 %
HCT: 39.8 % (ref 36.0–46.0)
Hemoglobin: 12.5 g/dL (ref 12.0–15.0)
Immature Granulocytes: 1 %
Lymphocytes Relative: 19 %
Lymphs Abs: 1 10*3/uL (ref 0.7–4.0)
MCH: 32.3 pg (ref 26.0–34.0)
MCHC: 31.4 g/dL (ref 30.0–36.0)
MCV: 102.8 fL — ABNORMAL HIGH (ref 80.0–100.0)
Monocytes Absolute: 0.5 10*3/uL (ref 0.1–1.0)
Monocytes Relative: 10 %
Neutro Abs: 3.7 10*3/uL (ref 1.7–7.7)
Neutrophils Relative %: 66 %
Platelets: 217 10*3/uL (ref 150–400)
RBC: 3.87 MIL/uL (ref 3.87–5.11)
RDW: 14.6 % (ref 11.5–15.5)
WBC: 5.5 10*3/uL (ref 4.0–10.5)
nRBC: 0 % (ref 0.0–0.2)

## 2019-12-12 LAB — VITAMIN B12: Vitamin B-12: 698 pg/mL (ref 180–914)

## 2019-12-12 LAB — MAGNESIUM: Magnesium: 2 mg/dL (ref 1.7–2.4)

## 2019-12-12 MED ORDER — LEVOTHYROXINE SODIUM 25 MCG PO TABS
125.0000 ug | ORAL_TABLET | Freq: Every day | ORAL | Status: DC
  Administered 2019-12-13 – 2019-12-15 (×3): 125 ug
  Filled 2019-12-12 (×3): qty 1

## 2019-12-12 MED ORDER — HYDRALAZINE HCL 25 MG PO TABS
25.0000 mg | ORAL_TABLET | Freq: Three times a day (TID) | ORAL | Status: DC
  Administered 2019-12-12 – 2019-12-15 (×9): 25 mg via ORAL
  Filled 2019-12-12 (×9): qty 1

## 2019-12-12 MED ORDER — CALCIUM CARBONATE-VITAMIN D 500-200 MG-UNIT PO TABS
1.0000 | ORAL_TABLET | Freq: Every day | ORAL | Status: DC
  Administered 2019-12-12 – 2019-12-15 (×4): 1 via ORAL
  Filled 2019-12-12 (×4): qty 1

## 2019-12-12 NOTE — Consult Note (Signed)
Consultation Note Date: 12/12/2019   Patient Name: Jillian Gay  DOB: [birth year]  MRN: [id number]  Age / Sex: 85 y.o., female  PCP: Burnard Bunting, MD Referring Physician: Jonnie Finner, DO  Reason for Consultation:  Establishing goals of care, discuss g-tube  HPI/Patient Profile:  Per Hospitalitist Progress Note --> 85 year old female with past medical history significant for CVA with residual right-sided weakness, slurred speech, hypertension, hyperlipidemia, hypothyroidism, who presented with change in mental status, unable to recognize family members since yesterday. Concern for new CVA, CT head done negative for any acute intracranial findings, showed old CVA. MRI brain ordered. Admitted for AMS/CVA work-up. MRI brain completed 12/21 and showed acute/subacute right ACA stroke. She had EEG slowing with occasional triphasic waves.  Clinical Assessment and Goals of Care: This NP reviewed patients chart. Met patient at bedside who was in no distress. Able to open eyes though unable to answer questions.   Gained collateral information from patients husband, [name]. Explained what Palliative care is and why we were consulted. Asked him what his understanding of Jillian Gay's illness is.  Mr. Mione is under the impression from speaking with Dr. Marylyn Ishihara that the patient has suffered a terminal stroke.   Asked [name] if he could give me a better impression of the type of woman Jillian Gay is. He stated, "she is finest women that ever walked." [name] is a people person who loves other people and loves life. He shared that they have had a wonderful life together.  Married for 59 years, raised five children who are all with their spouses which he considers to be an Investment banker, operational. Met in school in the 7th grade. It's been difficult in recent years after Jillian Gay suffered a stroke in 2011 which gradually left her disabled.  Had been wheelchair dependent since then. Was able to move and pivot only one step with assistance. They are members of the [facility] though recently had been "house bound" due to [place].   We talked about what Jillian Gay would want for herself at this point. He shared that Jillian Gay's wishes were nothing prolonged or heroic. I shared with him that I worried if we were to place a feeding tube it may cause more discomfort and possible life prolongation than she may have wished for. Explained the risks including pain, aspiration, and volume overload. He said that he understood these and asked that we "give it to him straight." Recommended veering away from a G-tube given the patients underlying conditions and severity of her recent stroke.   We discussed dignity and quality at the end of life. Shared the concept of hospice and what that may look like in Jillian Gay's case.  Per [name] he was told that the primary team would call his granddaughter tomorrow, [name] as she has experience in stroke patients as a CNA. [name] then plans to meet with his family members and discuss the next best steps.   Shared that the Palliative Service will remain involved to help answer any questions he may have. Told him  we would circle back on Monday after he has had a chance to speak with all of his family members. Also offered to assist in these conversations if needed.  Decision Maker: Patients Husband, [name] [phone]  SUMMARY OF RECOMMENDATIONS   DNAR/DNI Chaplain Consult Reach out to [name] Monday to see how family discussions transpire  Code Status/Advance Care Planning:  DNR   Symptom Management:  No symptoms which need management at this time  Palliative Prophylaxis:   Aspiration, Bowel Regimen, Delirium Protocol and Frequent Pain Assessment  Additional Recommendations (Limitations, Scope, Preferences):  Minimize Medications  Psycho-social/Spiritual:   Desire for further  Chaplaincy support: YES  Additional Recommendations: Caregiving  Support/Resources  Prognosis:   < 4 weeks  Discharge Planning: To Be Determined     Primary Diagnoses: Present on Admission: . AMS (altered mental status)  I have reviewed the medical record, interviewed the patient and family, and examined the patient. The following aspects are pertinent.  Past Medical History:  Diagnosis Date  . Allergy   . Hypertension   . Stroke ([place])   . Thyroid disease    Social History   Socioeconomic History  . Marital status: Married    Spouse name: Not on file  . Number of children: Not on file  . Years of education: Not on file  . Highest education level: Not on file  Occupational History  . Not on file  Tobacco Use  . Smoking status: Former Smoker    Types: Cigarettes    Quit date: 02/06/1992    Years since quitting: 27.8  Substance and Sexual Activity  . Alcohol use: Not on file  . Drug use: Not on file  . Sexual activity: Not on file  Other Topics Concern  . Not on file  Social History Narrative  . Not on file   Social Determinants of Health   Financial Resource Strain:   . Difficulty of Paying Living Expenses: Not on file  Food Insecurity:   . Worried About Charity fundraiser in the Last Year: Not on file  . Ran Out of Food in the Last Year: Not on file  Transportation Needs:   . Lack of Transportation (Medical): Not on file  . Lack of Transportation (Non-Medical): Not on file  Physical Activity:   . Days of Exercise per Week: Not on file  . Minutes of Exercise per Session: Not on file  Stress:   . Feeling of Stress : Not on file  Social Connections:   . Frequency of Communication with Friends and Family: Not on file  . Frequency of Social Gatherings with Friends and Family: Not on file  . Attends Religious Services: Not on file  . Active Member of Clubs or Organizations: Not on file  . Attends Archivist Meetings: Not on file  . Marital  Status: Not on file  Married for 5 yrs 5 children, 23 grandchildren [place]   No family history on file. Scheduled Meds: . aspirin  325 mg Per Tube Daily  . chlorhexidine  15 mL Mouth Rinse BID  . clopidogrel  75 mg Per Tube Daily  . heparin  5,000 Units Subcutaneous Q12H  . levothyroxine  125 mcg Per Tube Q0600  . lisinopril  40 mg Per Tube Daily  . mouth rinse  15 mL Mouth Rinse q12n4p  . polyethylene glycol  17 g Per Tube Daily  . senna-docusate  2 tablet Per Tube BID  . simvastatin  20 mg Per  Tube QPM   Continuous Infusions: . feeding supplement (JEVITY 1.2 CAL) 1,000 mL (12/11/19 1710)   PRN Meds:.acetaminophen **OR** acetaminophen (TYLENOL) oral liquid 160 mg/5 mL **OR** acetaminophen, bisacodyl, hydrALAZINE, LORazepam, morphine injection Medications Prior to Admission:  Prior to Admission medications   Medication Sig Start Date End Date Taking? Authorizing Provider  calcitonin, salmon, (MIACALCIN/FORTICAL) 200 UNIT/ACT nasal spray Place 1 spray into alternate nostrils daily. 11/12/19  Yes [provider]  calcium carbonate (OS-CAL) 600 MG TABS Take 600 mg by mouth 2 (two) times daily with a meal.   Yes [provider]  cholecalciferol (VITAMIN D) 400 UNITS TABS Take 400 Units by mouth.   Yes [provider]  clopidogrel (PLAVIX) 75 MG tablet Take 75 mg by mouth daily.   Yes [provider]  doxycycline (VIBRA-TABS) 100 MG tablet Take 100 mg by mouth 2 (two) times daily. 11/28/19  Yes [provider]  furosemide (LASIX) 40 MG tablet Take 20 mg by mouth daily.  11/12/19  Yes [provider]  KLOR-CON M20 20 MEQ tablet Take 20 mEq by mouth daily. 11/12/19  Yes [provider]  levothyroxine (SYNTHROID, LEVOTHROID) 125 MCG tablet Take 125 mcg by mouth daily.   Yes [provider]  lisinopril (PRINIVIL,ZESTRIL) 20 MG tablet Take 20 mg by mouth daily.   Yes [provider]  Multiple  Vitamins-Minerals (MULTIVITAMIN WITH MINERALS) tablet Take 1 tablet by mouth daily.   Yes [provider]  simvastatin (ZOCOR) 20 MG tablet Take 20 mg by mouth every evening.   Yes [provider]  vitamin C (ASCORBIC ACID) 500 MG tablet Take 500 mg by mouth 2 (two) times daily.   Yes [provider]   Allergies  Allergen Reactions  . Celebrex [Celecoxib]   . Macrodantin   . Metoprolol   . Norvasc [Amlodipine Besylate]    Review of Systems  Unable to perform ROS  Physical Exam Constitutional:      Comments: Elderly F in NAD  HENT:     Head: Normocephalic.     Nose: Nose normal.     Mouth/Throat:     Mouth: Mucous membranes are dry.  Eyes:     Pupils: Pupils are equal, round, and reactive to light.  Cardiovascular:     Rate and Rhythm: Normal rate.  Pulmonary:     Effort: Pulmonary effort is normal.  Abdominal:     Palpations: Abdomen is soft.  Musculoskeletal:     Cervical back: Normal range of motion.  Skin:    Capillary Refill: Capillary refill takes less than 2 seconds.     Coloration: Skin is pale.  Neurological:     Mental Status: She is disoriented.   Vital Signs: BP (!) 164/76 (BP Location: Right Arm)   Pulse (!) 58   Temp 98.6 F (37 C) (Axillary)   Resp (!) 22   Ht 5' 5" (1.651 m) Comment: per chart   Wt 94.4 kg   SpO2 95%   BMI 34.63 kg/m  Pain Scale: Faces   Pain Score: Asleep  SpO2: SpO2: 95 % O2 Device:SpO2: 95 % O2 Flow Rate: .   IO: Intake/output summary:   Intake/Output Summary (Last 24 hours) at 12/12/2019 1123 Last data filed at 12/12/2019 0700 Gross per 24 hour  Intake 2975.83 ml  Output 600 ml  Net 2375.83 ml    LBM: Last BM Date: 12/10/19 Baseline Weight: Weight: 91.2 kg Most recent weight: Weight: 94.4 kg     Palliative  Assessment/Data: 10%   Time In: 1030 Time Out: 1145 Time Total: 75 Greater than 50%  of this time was spent counseling and coordinating care related to the above assessment and  plan.  Signed by: Rosezella Rumpf, NP  Alda Lea, NP    Please contact Palliative Medicine Team phone at [phone] for questions and concerns.  For individual provider: See Shea Evans

## 2019-12-12 NOTE — Progress Notes (Signed)
[facility]  PROGRESS NOTE    Jillian Gay  ZDG:[id number] DOB: [birth year] DOA: 12/05/2019 PCP: Burnard Bunting, MD   Brief Narrative:   85 year old female with past medical history significant for CVA with residual right-sided weakness, slurred speech, hypertension, hyperlipidemia, hypothyroidism, who presented with change in mental status, unable to recognize family members since yesterday. Concern for new CVA, CT head done negative for any acute intracranial findings, showed old CVA. MRI brain ordered. Admitted for AMS/CVA work-up. MRI brain completed 12/21 and showed acute/subacute right ACA stroke. She had EEG slowing with occasional triphasic waves.  12/12/19: Appreciate PC assistance. BP still up. Can add hydralazine scheduled.    Assessment & Plan:   Active Problems:   Altered mental state   AMS (altered mental status)   Cerebral thrombosis with cerebral infarction  Acute/subacute right ACA CVA in the setting of prior CVA. - positive MRI brain 12/08/2019. - LDL 54, goal less than 70; continue simvastatin 20 mg daily - A1c 6.3; goal less than 7.0 - PT/OT rec CIR; Inpt Rehab eval'd, feel she is more appropriate for SNF - SLP rec n.p.o; Cortrak placed 12/21; need permanent solution, will d/w family - 2D echo with bubble study and bilateral duplex ultrasound no evidence of PFO or thrombus.      - Normal LVEF with grade 1 diastolic dysfunction. - Carotid ultrasound unremarkable - Continue plavix, aspirin 81 mg daily as recommended by neurology stroke team     - 12/11/19: attempting to talk to family; unable to reach husband; spoke with dtr, deferring to husband at this time; need permanent solution for feeds/nutrition     - 12/12/19: spoke with husband yesterday Re: PEG; will need to speak with granddtr; PC onboard, appreciate assistance  Acute metabolic encephalopathy, likely secondary to acute/subacute right ACA CVA. - CT head showed  old CVA with no acute intracranial findings - elevated TSH 6, free T4 normal - Continue home levothyroxine, switched to IV due to NPO  T12 compression fracture - Seen on MRI spine. - Pain management in place.  Enterococcus faecalis UTI - WBC UA 11-20>>UCX grew 30,000 Enterococcus faecalis, pansensitive. - s/p ampicillin  History of CVA with right sided weakness - Continue PT OT with assistance and fall precautions - Recommended CIR placement; Inpt Rehab eval'd, feel she is more appropriate for SNF  Dysphagia - SLP rec NPO - Cortrak placement 12/21; need permanent solution  Subclinical hypothyroidism - TSH elevated 6 - Free T4 normal - Continue levothyroxine, switched to IV due to NPO  Essential hypertension - lisinopril 40mg ; add hydralazine 25 mg TID  Hypokalemia - resolved   DVT prophylaxis: heparin Code Status: DNR   Disposition Plan: TBD  Consultants:   Neurology  PC  Subjective: No acute events ON.   Objective: Vitals:   12/12/19 0519 12/12/19 0531 12/12/19 0723 12/12/19 0727  BP: (!) 142/62   (!) 164/76  Pulse: (!) 59  (!) 57 (!) 58  Resp:   20 (!) 22  Temp: 99.2 F (37.3 C)   98.6 F (37 C)  TempSrc: Axillary   Axillary  SpO2: 96%  94% 95%  Weight:  94.4 kg    Height:        Intake/Output Summary (Last 24 hours) at 12/12/2019 1356 Last data filed at 12/12/2019 0700 Gross per 24 hour  Intake 2975.83 ml  Output 600 ml  Net 2375.83 ml   Filed Weights   12/10/19 0407 12/11/19 0421 12/12/19 0531  Weight: 92.4 kg 94.1 kg 94.4  kg    Examination:  General: 85 y.o. female resting in bed in NAD Cardiovascular: RRR, +S1, S2, no m/g/r Respiratory: CTABL, no w/r/r, normal WOB GI: BS+, NDNT, no masses noted, cortrak in place MSK: No e/c/c Neuro: tracking around room, not following commands  Data Reviewed: I have personally reviewed following labs and imaging  studies.  CBC: Recent Labs  Lab 12/08/19 0322 12/09/19 0358 12/10/19 0945 12/11/19 0350 12/12/19 0903  WBC 5.7 5.2 4.5 4.6 5.5  NEUTROABS  --   --   --   --  3.7  HGB 12.4 11.9* 12.4 11.8* 12.5  HCT 38.1 36.2 37.6 36.9 39.8  MCV 100.0 99.2 99.7 100.8* 102.8*  PLT 242 251 206 228 217   Basic Metabolic Panel: Recent Labs  Lab 12/07/19 0342 12/08/19 0322 12/09/19 0358 12/10/19 0945 12/11/19 0350 12/12/19 0903  NA 144 145 140 145 146* 143  K 3.6 3.2* 3.3* 4.0 4.1 4.5  CL 107 107 106 111 111 108  CO2 27 28 24 24 26 23   GLUCOSE 122* 131* 131* 155* 159* 177*  BUN 12 11 11 14 16 18   CREATININE 0.81 0.66 0.76 0.71 0.73 0.78  CALCIUM 8.7* 8.7* 8.7* 9.0 8.9 9.1  MG 2.1 2.0  --   --   --  2.0  PHOS  --   --   --   --   --  3.0   GFR: Estimated Creatinine Clearance: 58.4 mL/min (by C-G formula based on SCr of 0.78 mg/dL). Liver Function Tests: Recent Labs  Lab 12/12/19 0903  ALBUMIN 2.6*   No results for input(s): LIPASE, AMYLASE in the last 168 hours. Recent Labs  Lab 12/06/19 1823  AMMONIA 13   Coagulation Profile: No results for input(s): INR, PROTIME in the last 168 hours. Cardiac Enzymes: No results for input(s): CKTOTAL, CKMB, CKMBINDEX, TROPONINI in the last 168 hours. BNP (last 3 results) No results for input(s): PROBNP in the last 8760 hours. HbA1C: No results for input(s): HGBA1C in the last 72 hours. CBG: Recent Labs  Lab 12/11/19 0747 12/11/19 2303 12/12/19 0628 12/12/19 0724 12/12/19 1230  GLUCAP 157* 142* 174* 159* 137*   Lipid Profile: No results for input(s): CHOL, HDL, LDLCALC, TRIG, CHOLHDL, LDLDIRECT in the last 72 hours. Thyroid Function Tests: No results for input(s): TSH, T4TOTAL, FREET4, T3FREE, THYROIDAB in the last 72 hours. Anemia Panel: No results for input(s): VITAMINB12, FOLATE, FERRITIN, TIBC, IRON, RETICCTPCT in the last 72 hours. Sepsis Labs: No results for input(s): PROCALCITON, LATICACIDVEN in the last 168  hours.  Recent Results (from the past 240 hour(s))  SARS CORONAVIRUS 2 (TAT 6-24 HRS) Nasopharyngeal Nasopharyngeal Swab     Status: None   Collection Time: 12/05/19 10:07 AM   Specimen: Nasopharyngeal Swab  Result Value Ref Range Status   SARS Coronavirus 2 NEGATIVE NEGATIVE Final    Comment: (NOTE) SARS-CoV-2 target nucleic acids are NOT DETECTED. The SARS-CoV-2 RNA is generally detectable in upper and lower respiratory specimens during the acute phase of infection. Negative results do not preclude SARS-CoV-2 infection, do not rule out co-infections with other pathogens, and should not be used as the sole basis for treatment or other patient management decisions. Negative results must be combined with clinical observations, patient history, and epidemiological information. The expected result is Negative. Fact Sheet for Patients: 12/14/19 Fact Sheet for Healthcare Providers: 12/07/19 This test is not yet approved or cleared by the [place] FDA and  has been authorized for detection and/or diagnosis of  SARS-CoV-2 by FDA under an Emergency Use Authorization (EUA). This EUA will remain  in effect (meaning this test can be used) for the duration of the COVID-19 declaration under Section 56 4(b)(1) of the Act, 21 U.S.C. section 360bbb-3(b)(1), unless the authorization is terminated or revoked sooner. Performed at [facility], [address] [place], [place] [address]   Culture, Urine     Status: Abnormal   Collection Time: 12/06/19 11:29 AM   Specimen: Urine, Clean Catch  Result Value Ref Range Status   Specimen Description URINE, CLEAN CATCH  Final   Special Requests   Final    Normal Performed at [facility], [address] [place], [place] [address]    Culture 30,000 COLONIES/mL ENTEROCOCCUS FAECALIS (A)  Final   Report Status 12/08/2019 FINAL  Final   Organism ID, Bacteria ENTEROCOCCUS  FAECALIS (A)  Final      Susceptibility   Enterococcus faecalis - MIC*    AMPICILLIN <=2 SENSITIVE Sensitive     NITROFURANTOIN <=16 SENSITIVE Sensitive     VANCOMYCIN 1 SENSITIVE Sensitive     * 30,000 COLONIES/mL ENTEROCOCCUS FAECALIS      Radiology Studies: No results found.   Scheduled Meds: . aspirin  325 mg Per Tube Daily  . [START ON 12/13/2019] calcium-vitamin D  1 tablet Oral Q breakfast  . chlorhexidine  15 mL Mouth Rinse BID  . clopidogrel  75 mg Per Tube Daily  . heparin  5,000 Units Subcutaneous Q12H  . levothyroxine  125 mcg Per Tube Q0600  . lisinopril  40 mg Per Tube Daily  . mouth rinse  15 mL Mouth Rinse q12n4p  . polyethylene glycol  17 g Per Tube Daily  . senna-docusate  2 tablet Per Tube BID  . simvastatin  20 mg Per Tube QPM   Continuous Infusions: . feeding supplement (JEVITY 1.2 CAL) 1,000 mL (12/11/19 1710)     LOS: 7 days    Time spent: 25 minutes spent in the coordination of care today.    Teddy Spikeyrone A Rica Heather, DO [facility]  If 7PM-7AM, please contact night-coverage [email or web address] 12/12/2019, 1:56 PM

## 2019-12-13 LAB — GLUCOSE, CAPILLARY
Glucose-Capillary: 153 mg/dL — ABNORMAL HIGH (ref 70–99)
Glucose-Capillary: 156 mg/dL — ABNORMAL HIGH (ref 70–99)
Glucose-Capillary: 157 mg/dL — ABNORMAL HIGH (ref 70–99)
Glucose-Capillary: 158 mg/dL — ABNORMAL HIGH (ref 70–99)
Glucose-Capillary: 166 mg/dL — ABNORMAL HIGH (ref 70–99)
Glucose-Capillary: 189 mg/dL — ABNORMAL HIGH (ref 70–99)

## 2019-12-13 LAB — CBC WITH DIFFERENTIAL/PLATELET
Abs Immature Granulocytes: 0.02 10*3/uL (ref 0.00–0.07)
Basophils Absolute: 0 10*3/uL (ref 0.0–0.1)
Basophils Relative: 1 %
Eosinophils Absolute: 0.2 10*3/uL (ref 0.0–0.5)
Eosinophils Relative: 4 %
HCT: 40.9 % (ref 36.0–46.0)
Hemoglobin: 12.9 g/dL (ref 12.0–15.0)
Immature Granulocytes: 0 %
Lymphocytes Relative: 16 %
Lymphs Abs: 1.1 10*3/uL (ref 0.7–4.0)
MCH: 32.6 pg (ref 26.0–34.0)
MCHC: 31.5 g/dL (ref 30.0–36.0)
MCV: 103.3 fL — ABNORMAL HIGH (ref 80.0–100.0)
Monocytes Absolute: 0.6 10*3/uL (ref 0.1–1.0)
Monocytes Relative: 9 %
Neutro Abs: 4.6 10*3/uL (ref 1.7–7.7)
Neutrophils Relative %: 70 %
Platelets: 249 10*3/uL (ref 150–400)
RBC: 3.96 MIL/uL (ref 3.87–5.11)
RDW: 14.6 % (ref 11.5–15.5)
WBC: 6.6 10*3/uL (ref 4.0–10.5)
nRBC: 0 % (ref 0.0–0.2)

## 2019-12-13 LAB — RENAL FUNCTION PANEL
Albumin: 2.8 g/dL — ABNORMAL LOW (ref 3.5–5.0)
Anion gap: 10 (ref 5–15)
BUN: 21 mg/dL (ref 8–23)
CO2: 26 mmol/L (ref 22–32)
Calcium: 9.1 mg/dL (ref 8.9–10.3)
Chloride: 110 mmol/L (ref 98–111)
Creatinine, Ser: 0.77 mg/dL (ref 0.44–1.00)
GFR calc Af Amer: >60 mL/min (ref >60)
GFR calc non Af Amer: >60 mL/min (ref >60)
Glucose, Bld: 196 mg/dL — ABNORMAL HIGH (ref 70–99)
Phosphorus: 3 mg/dL (ref 2.5–4.6)
Potassium: 4.3 mmol/L (ref 3.5–5.1)
Sodium: 146 mmol/L — ABNORMAL HIGH (ref 135–145)

## 2019-12-13 LAB — MAGNESIUM: Magnesium: 2.1 mg/dL (ref 1.7–2.4)

## 2019-12-13 NOTE — Progress Notes (Signed)
Facilities manager received referral from [facility] for pt to return home under hospice care. Spoke with pts granddaughter, Marylin Crosby, to confirm interest. Pts chart in review by [facility] MD to determine hospice eligibility. Will confirm with hospital [facility] manager and patients family once approval is completed. Pts family denies any current DME needs. Please to not hesitate to outreach with any questions and thank you for the referral.      Thank you,  Freddie Breech, RN  [facility] Liaison [phone]

## 2019-12-13 NOTE — TOC Initial Note (Signed)
Transition of Care [facility]) - Initial/Assessment Note    Patient Details  Name: Jillian Gay MRN: [id number] Date of Birth: [birth year]  Transition of Care [facility]) CM/SW Contact:    Claudie Leach, RN 12/13/2019, 5:32 PM  Clinical Narrative:                 Consult received for home hospice arrangements.  Palliative met with family and discussed home hospice options.  The family met today and has elected to go home with hospice.  I discussed dc plan with granddaughter, Joslyn Devon, and patient's husband.  They state the patient made her wishes clear that she would want to die at home and not in a facility. Therefore, they are not interested in [facility].  Leanne, granddaughter, is main contact and would like for [place] to discuss plan with her first.  She and patient's husband and other children will share the 24 hour care of Ms. Maisano.   They have a hospital bed and hoyer lift in the home.  The patient is not currently using supplemental oxygen. The granddaughter requests that the Foley catheter be left in place for d/c.     After discussing Medicare hospice list, Joslyn Devon and Mr. Lindenbaum choose [facility].  Bradd Canary from [facility] accepts referral and will contact the Leanne this evening to start the process of admission.  Per Dr. Marylyn Ishihara and family, the goal for discharge is Monday.  Discharge Monday is likely since no DME is needed.     Expected Discharge Plan: [place]     Patient Goals and CMS Choice Patient states their goals for this hospitalization and ongoing recovery are:: for Amyla to spend her last days and die at home CMS Medicare.gov Compare Post Acute Care list provided to:: Patient Represenative (must comment)(husband and granddaughter) Choice offered to / list presented to : Spouse, Adult Children  Expected Discharge Plan and Services Expected Discharge Plan: [place]   Discharge Planning Services: CM Consult Post Acute  Care Choice: Hospice Living arrangements for the past 2 months: Single Family Home                     Prior Living Arrangements/Services Living arrangements for the past 2 months: Single Family Home Lives with:: Spouse Patient language and need for interpreter reviewed:: Yes        Need for Family Participation in Patient Care: Yes (Comment) Care giver support system in place?: Yes (comment) Current home services: DME Criminal Activity/Legal Involvement Pertinent to Current Situation/Hospitalization: No - Comment as needed   Emotional Assessment Appearance:: Appears younger than stated age Attitude/Demeanor/Rapport: Unresponsive Affect (typically observed): Unable to Assess   Alcohol / Substance Use: Not Applicable Psych Involvement: No (comment)  Admission diagnosis:  Altered mental status, unspecified altered mental status type [R41.82] AMS (altered mental status) [R41.82] Patient Active Problem List   Diagnosis Date Noted  . Cerebral thrombosis with cerebral infarction 12/09/2019  . Altered mental state 12/05/2019  . AMS (altered mental status) 12/05/2019  . 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 Neurogenic bladder 02/06/2012   PCP:  Burnard Bunting, MD Pharmacy:   [facility], [place] [address], [place] [address] Phone: [phone] Fax: [phone]  [id number] [id number]  [id number]: [phone]:  [phone]      

## 2019-12-13 NOTE — Progress Notes (Signed)
  Speech Language Pathology Treatment: Dysphagia  Patient Details Name: Jillian Gay MRN: [id number] DOB: 05/25/1932 Today's Date: 12/13/2019 Time: 0850-0920 SLP Time Calculation (min) (ACUTE ONLY): 30 min  Assessment / Plan / Recommendation Clinical Impression  Pt awakened and sustained eyes open with minimal stim and repositioning, but focused and sustained attention to therapist remained poor. With constant verbal and tactile cues SLP was able to assist pt in accepting spoon bites of ice, puree, thin and honey thick water. Prolonged oral holding prevalent throughout with constant dry spoon, cheek massage and repositioning needed to elicit oral transit and swallow response. Immediate coughing only observed when cup sips of thin water were attempted.   Pt does have a swallow response, and a diet for therapeutic/comfort measures could be attempted at this point if family were willing to assume risk of aspiration with attempts. Pt unlikely to progress at all without frequent attempts to offer PO. If PO were given oral suction would be needed in the room to remove pocketed/residual material. Discussed with RN. MD to order Dys 1/honey thick liquids if comfort diet accepted.     HPI HPI: Jillian Gay is a 85 y.o. female with medical history significant of stroke with residual right-sided weakness, aphasia, slurred speech, hypertension hyperlipidemia, thyroidism, presented with running of her weakness for 7 days and change in mental status for 2 days.   Granddaughter reported choking event piror to admission.  MRI brain completed 12/21 and showed acute/subacute right ACA stroke.  CXR on 12/18 reported "Low lung volumes with streaky bibasilar opacities, likely atelectasis".       SLP Plan  Continue with current plan of care       Recommendations  Diet recommendations: Dysphagia 1 (puree);Honey-thick liquid Liquids provided via: Teaspoon Medication Administration: Via alternative  means Supervision: Trained caregiver to feed patient Compensations: Small sips/bites;Slow rate;Lingual sweep for clearance of pocketing;Multiple dry swallows after each bite/sip                SLP Visit Diagnosis: Dysphagia, oropharyngeal phase (R13.12) Plan: Continue with current plan of care       GO                Natasja Niday, Katherene Ponto 12/13/2019, 9:31 AM

## 2019-12-13 NOTE — Progress Notes (Addendum)
Marland Kitchen.  PROGRESS NOTE    Jillian Gay  [id number] DOB: 04-27-1932 DOA: 12/05/2019 PCP: Geoffry ParadiseAronson, Richard, MD   Brief Narrative:   85 year old female with past medical history significant for CVA with residual right-sided weakness, slurred speech, hypertension, hyperlipidemia, hypothyroidism, who presented with change in mental status, unable to recognize family members since yesterday. Concern for new CVA, CT head done negative for any acute intracranial findings, showed old CVA. MRI brain ordered. Admitted for AMS/CVA work-up. MRI brain completed 12/21 and showed acute/subacute right ACA stroke. She had EEG slowing with occasional triphasic waves.  12/13/19: Conversation with granddtr Re: PEG today. No acute events ON.   Spoke with family second time. They are declining a PEG. They do not want her to transition to pleasure feeds right now. They are in favor of bringing her home with hospice. At this time leave cortrak. Will d/c at discharge. Will update CM.   Assessment & Plan:   Active Problems:   Altered mental state   AMS (altered mental status)   Cerebral thrombosis with cerebral infarction  Acute/subacute right ACA CVA in the setting of prior CVA. - positive MRI brain 12/08/2019. - LDL 54, goal less than 70; continue simvastatin 20 mg daily - A1c 6.3; goal less than 7.0 - PT/OT rec CIR; Inpt Rehab eval'd, feel she is more appropriate for SNF - SLP rec n.p.o; Cortrak placed 12/21; need permanent solution, will d/w family - 2D echo with bubble study and bilateral duplex ultrasound no evidence of PFO or thrombus.  - Normal LVEF with grade 1 diastolic dysfunction. - Carotid ultrasound unremarkable - Continue plavix, aspirin 81 mg daily as recommended by neurology stroke team - 12/11/19: attempting to talk to family; unable to reach husband; spoke with dtr, deferring to husband at this time; need permanent solution for  feeds/nutrition     - 12/12/19: spoke with husband yesterday Re: PEG; will need to speak with granddtr; PC onboard, appreciate assistance     - 12/13/19: spoke with granddtr about SLP recommendations and PEG tube. She states that she is pretty sure that family would not want PEG placed but she needs to talk with family before making a decision. I explained that if CC/hospice is decided upon; we would work to make this happen in the home.   Acute metabolic encephalopathy, likely secondary to acute/subacute right ACA CVA. - CT head showed old CVA with no acute intracranial findings - elevated TSH 6, free T4 normal - Continue home levothyroxine, switched to IV due to NPO   T12 compression fracture - Seen on MRI spine. - Pain management in place.  Enterococcus faecalis UTI - WBC UA 11-20>>UCX grew 30,000 Enterococcus faecalis, pansensitive. - s/p ampicillin  History of CVA with right sided weakness - Continue PT OT with assistance and fall precautions - Recommended CIR placement; Inpt Rehab eval'd, feel she is more appropriate for SNF  Dysphagia - SLP rec NPO - Cortrak placement 12/21; need permanent solution  Subclinical hypothyroidism - TSH elevated 6 - Free T4 normal - Continue levothyroxine, switched to IV due to NPO     - levothyroxine per tube  Essential hypertension - lisinopril 40mg ; add hydralazine 25 mg TID  Hypokalemia - resolved  DVT prophylaxis: heparin Code Status: DNR Family Communication: With granddtr by phone   Disposition Plan: TBD  Consultants:   Neurology  PC  Subjective: No acute events ON.   Objective: Vitals:   12/13/19 0400 12/13/19 0500 12/13/19 0738 12/13/19 1120  BP:  Marland Kitchen(!)  146/75 (!) 156/71 (!) 174/67  Pulse: 74 80 89 81  Resp: 14 20    Temp:  98.3 F (36.8 C) 99.8 F (37.7 C) 98.9 F (37.2 C)  TempSrc:  Axillary Oral Oral  SpO2: 93% 94% 95% 97%  Weight:   93.6 kg    Height:        Intake/Output Summary (Last 24 hours) at 12/13/2019 1212 Last data filed at 12/13/2019 0500 Gross per 24 hour  Intake 495 ml  Output 300 ml  Net 195 ml   Filed Weights   12/11/19 0421 12/12/19 0531 12/13/19 0500  Weight: 94.1 kg 94.4 kg 93.6 kg    Examination:  General: 85 y.o. female resting in bed in NAD Cardiovascular: RRR, +S1, S2, no m/g/r, equal pulses throughout Respiratory: CTABL, no w/r/r, normal WOB GI: BS+, NDNT, cortrak MSK: No e/c/c Neuro: somnolent  Data Reviewed: I have personally reviewed following labs and imaging studies.  CBC: Recent Labs  Lab 12/09/19 0358 12/10/19 0945 12/11/19 0350 12/12/19 0903 12/13/19 0435  WBC 5.2 4.5 4.6 5.5 6.6  NEUTROABS  --   --   --  3.7 4.6  HGB 11.9* 12.4 11.8* 12.5 12.9  HCT 36.2 37.6 36.9 39.8 40.9  MCV 99.2 99.7 100.8* 102.8* 103.3*  PLT 251 206 228 217 249   Basic Metabolic Panel: Recent Labs  Lab 12/07/19 0342 12/08/19 0322 12/09/19 0358 12/10/19 0945 12/11/19 0350 12/12/19 0903 12/13/19 0435  NA 144 145 140 145 146* 143 146*  K 3.6 3.2* 3.3* 4.0 4.1 4.5 4.3  CL 107 107 106 111 111 108 110  CO2 27 28 24 24 26 23 26   GLUCOSE 122* 131* 131* 155* 159* 177* 196*  BUN 12 11 11 14 16 18 21   CREATININE 0.81 0.66 0.76 0.71 0.73 0.78 0.77  CALCIUM 8.7* 8.7* 8.7* 9.0 8.9 9.1 9.1  MG 2.1 2.0  --   --   --  2.0 2.1  PHOS  --   --   --   --   --  3.0 3.0   GFR: Estimated Creatinine Clearance: 58.1 mL/min (by C-G formula based on SCr of 0.77 mg/dL). Liver Function Tests: Recent Labs  Lab 12/12/19 0903 12/13/19 0435  ALBUMIN 2.6* 2.8*   No results for input(s): LIPASE, AMYLASE in the last 168 hours. Recent Labs  Lab 12/06/19 1823  AMMONIA 13   Coagulation Profile: No results for input(s): INR, PROTIME in the last 168 hours. Cardiac Enzymes: No results for input(s): CKTOTAL, CKMB, CKMBINDEX, TROPONINI in the last 168 hours. BNP (last 3 results) No results for input(s):  PROBNP in the last 8760 hours. HbA1C: No results for input(s): HGBA1C in the last 72 hours. CBG: Recent Labs  Lab 12/12/19 1951 12/13/19 0031 12/13/19 0452 12/13/19 0736 12/13/19 1118  GLUCAP 152* 166* 189* 156* 158*   Lipid Profile: No results for input(s): CHOL, HDL, LDLCALC, TRIG, CHOLHDL, LDLDIRECT in the last 72 hours. Thyroid Function Tests: No results for input(s): TSH, T4TOTAL, FREET4, T3FREE, THYROIDAB in the last 72 hours. Anemia Panel: Recent Labs    12/12/19 1454  VITAMINB12 698   Sepsis Labs: No results for input(s): PROCALCITON, LATICACIDVEN in the last 168 hours.  Recent Results (from the past 240 hour(s))  SARS CORONAVIRUS 2 (TAT 6-24 HRS) Nasopharyngeal Nasopharyngeal Swab     Status: None   Collection Time: 12/05/19 10:07 AM   Specimen: Nasopharyngeal Swab  Result Value Ref Range Status   SARS Coronavirus 2 NEGATIVE NEGATIVE Final  Comment: (NOTE) SARS-CoV-2 target nucleic acids are NOT DETECTED. The SARS-CoV-2 RNA is generally detectable in upper and lower respiratory specimens during the acute phase of infection. Negative results do not preclude SARS-CoV-2 infection, do not rule out co-infections with other pathogens, and should not be used as the sole basis for treatment or other patient management decisions. Negative results must be combined with clinical observations, patient history, and epidemiological information. The expected result is Negative. Fact Sheet for Patients: SugarRoll.be Fact Sheet for Healthcare Providers: https://www.woods-mathews.com/ This test is not yet approved or cleared by the [place] FDA and  has been authorized for detection and/or diagnosis of SARS-CoV-2 by FDA under an Emergency Use Authorization (EUA). This EUA will remain  in effect (meaning this test can be used) for the duration of the COVID-19 declaration under Section 56 4(b)(1) of the Act, 21 U.S.C. section  360bbb-3(b)(1), unless the authorization is terminated or revoked sooner. Performed at [facility], [place] [address], [place], [place] [address]   Culture, Urine     Status: Abnormal   Collection Time: 12/06/19 11:29 AM   Specimen: Urine, Clean Catch  Result Value Ref Range Status   Specimen Description URINE, CLEAN CATCH  Final   Special Requests   Final    Normal Performed at [facility], [place] [address], [place], [place] [address]    Culture 30,000 COLONIES/mL ENTEROCOCCUS FAECALIS (A)  Final   Report Status 12/08/2019 FINAL  Final   Organism ID, Bacteria ENTEROCOCCUS FAECALIS (A)  Final      Susceptibility   Enterococcus faecalis - MIC*    AMPICILLIN <=2 SENSITIVE Sensitive     NITROFURANTOIN <=16 SENSITIVE Sensitive     VANCOMYCIN 1 SENSITIVE Sensitive     * 30,000 COLONIES/mL ENTEROCOCCUS FAECALIS      Radiology Studies: No results found.   Scheduled Meds: . aspirin  325 mg Per Tube Daily  . calcium-vitamin D  1 tablet Oral Q breakfast  . chlorhexidine  15 mL Mouth Rinse BID  . clopidogrel  75 mg Per Tube Daily  . heparin  5,000 Units Subcutaneous Q12H  . hydrALAZINE  25 mg Oral Q8H  . levothyroxine  125 mcg Per Tube Q0600  . lisinopril  40 mg Per Tube Daily  . mouth rinse  15 mL Mouth Rinse q12n4p  . polyethylene glycol  17 g Per Tube Daily  . senna-docusate  2 tablet Per Tube BID  . simvastatin  20 mg Per Tube QPM   Continuous Infusions: . feeding supplement (JEVITY 1.2 CAL) 1,000 mL (12/13/19 1013)     LOS: 8 days    Time spent: 35 minutes spent in the coordination of care today.    Jonnie Finner, DO [facility]  If 7PM-7AM, please contact night-coverage [email or web address] 12/13/2019, 12:13 PM

## 2019-12-14 LAB — CBC WITH DIFFERENTIAL/PLATELET
Abs Immature Granulocytes: 0.05 10*3/uL (ref 0.00–0.07)
Basophils Absolute: 0 10*3/uL (ref 0.0–0.1)
Basophils Relative: 0 %
Eosinophils Absolute: 0.2 10*3/uL (ref 0.0–0.5)
Eosinophils Relative: 3 %
HCT: 39.2 % (ref 36.0–46.0)
Hemoglobin: 12.5 g/dL (ref 12.0–15.0)
Immature Granulocytes: 1 %
Lymphocytes Relative: 16 %
Lymphs Abs: 1.2 10*3/uL (ref 0.7–4.0)
MCH: 32.5 pg (ref 26.0–34.0)
MCHC: 31.9 g/dL (ref 30.0–36.0)
MCV: 101.8 fL — ABNORMAL HIGH (ref 80.0–100.0)
Monocytes Absolute: 0.8 10*3/uL (ref 0.1–1.0)
Monocytes Relative: 11 %
Neutro Abs: 5 10*3/uL (ref 1.7–7.7)
Neutrophils Relative %: 69 %
Platelets: 245 10*3/uL (ref 150–400)
RBC: 3.85 MIL/uL — ABNORMAL LOW (ref 3.87–5.11)
RDW: 14.8 % (ref 11.5–15.5)
WBC: 7.2 10*3/uL (ref 4.0–10.5)
nRBC: 0 % (ref 0.0–0.2)

## 2019-12-14 LAB — RENAL FUNCTION PANEL
Albumin: 2.7 g/dL — ABNORMAL LOW (ref 3.5–5.0)
Anion gap: 9 (ref 5–15)
BUN: 22 mg/dL (ref 8–23)
CO2: 26 mmol/L (ref 22–32)
Calcium: 9.2 mg/dL (ref 8.9–10.3)
Chloride: 108 mmol/L (ref 98–111)
Creatinine, Ser: 0.74 mg/dL (ref 0.44–1.00)
GFR calc Af Amer: >60 mL/min (ref >60)
GFR calc non Af Amer: >60 mL/min (ref >60)
Glucose, Bld: 194 mg/dL — ABNORMAL HIGH (ref 70–99)
Phosphorus: 3.3 mg/dL (ref 2.5–4.6)
Potassium: 4.4 mmol/L (ref 3.5–5.1)
Sodium: 143 mmol/L (ref 135–145)

## 2019-12-14 LAB — GLUCOSE, CAPILLARY
Glucose-Capillary: 155 mg/dL — ABNORMAL HIGH (ref 70–99)
Glucose-Capillary: 162 mg/dL — ABNORMAL HIGH (ref 70–99)
Glucose-Capillary: 163 mg/dL — ABNORMAL HIGH (ref 70–99)
Glucose-Capillary: 170 mg/dL — ABNORMAL HIGH (ref 70–99)
Glucose-Capillary: 174 mg/dL — ABNORMAL HIGH (ref 70–99)
Glucose-Capillary: 187 mg/dL — ABNORMAL HIGH (ref 70–99)

## 2019-12-14 LAB — MAGNESIUM: Magnesium: 2.2 mg/dL (ref 1.7–2.4)

## 2019-12-14 NOTE — Progress Notes (Signed)
Marland Kitchen  PROGRESS NOTE    Jillian Gay  IWL:[id number] DOB: [birth year] DOA: 12/05/2019 PCP: Geoffry Paradise, MD   Brief Narrative:   85 year old female with past medical history significant for CVA with residual right-sided weakness, slurred speech, hypertension, hyperlipidemia, hypothyroidism, who presented with change in mental status, unable to recognize family members since yesterday. Concern for new CVA, CT head done negative for any acute intracranial findings, showed old CVA. MRI brain ordered. Admitted for AMS/CVA work-up. MRI brain completed 12/21 and showed acute/subacute right ACA stroke. She had EEG slowing with occasional triphasic waves.  12/14/19: No acute events ON. Family has elected for hospice at home. Will plan for d/c to home tomorrow.    Assessment & Plan:   Active Problems:   Altered mental state   AMS (altered mental status)   Cerebral thrombosis with cerebral infarction  Acute/subacute right ACA CVA in the setting of prior CVA. - positive MRI brain 12/08/2019. - LDL 54, goal less than 70; continue simvastatin 20 mg daily - A1c 6.3; goal less than 7.0 - PT/OT rec CIR; Inpt Rehab eval'd, feel she is more appropriate for SNF - SLP rec n.p.o; Cortrak placed 12/21; need permanent solution, will d/w family - 2D echo with bubble study and bilateral duplex ultrasound no evidence of PFO or thrombus.  - Normal LVEF with grade 1 diastolic dysfunction. - Carotid ultrasound unremarkable - Continue plavix, aspirin 81 mg daily as recommended by neurology stroke team - 12/11/19: attempting to talk to family; unable to reach husband; spoke with dtr, deferring to husband at this time; need permanent solution for feeds/nutrition - 12/12/19: spoke with husband yesterday Re: PEG; will need to speak with granddtr; PC onboard, appreciate assistance     - 12/13/19: spoke with granddtr about SLP recommendations and PEG tube. She  states that she is pretty sure that family would not want PEG placed but she needs to talk with family before making a decision. I explained that if CC/hospice is decided upon; we would work to make this happen in the home.      - 12/14/19: family has elected to take home with hospice; d/c in AM  Acute metabolic encephalopathy, likely secondary to acute/subacute right ACA CVA. - CT head showed old CVA with no acute intracranial findings - elevated TSH 6, free T4 normal - Continue home levothyroxine, per tube  T12 compression fracture - Seen on MRI spine. - Pain management in place.  Enterococcus faecalis UTI - WBC UA 11-20>>UCX grew 30,000 Enterococcus faecalis, pansensitive. -s/pampicillin  History of CVA with right sided weakness - Continue PT OT with assistance and fall precautions - Recommended CIR placement; Inpt Rehab eval'd, feel she is more appropriate for SNF  Dysphagia - SLP rec NPO - Cortrak placement 12/21; need permanent solution  Subclinical hypothyroidism - TSH elevated 6 - Free T4 normal - Continue levothyroxine, switched to IV due to NPO     - levothyroxine per tube  Essential hypertension - lisinopril 40mg ; add hydralazine 25 mg TID  Hypokalemia - resolved  DVT prophylaxis: heparin Code Status: DNR Family Communication: With granddtr by phone   Disposition Plan: To home with hospice  Consultants:   Neurology  PC  Subjective: No acute events ON.   Objective: Vitals:   12/14/19 0031 12/14/19 0100 12/14/19 0352 12/14/19 0752  BP:   139/60 (!) 149/64  Pulse: 67  78 77  Resp: (!) 21 18 17 18   Temp:   98 F (36.7 C) 99.9 F (37.7  C)  TempSrc:   Oral Axillary  SpO2: 95%  95% 94%  Weight:   94 kg   Height:        Intake/Output Summary (Last 24 hours) at 12/14/2019 1418 Last data filed at 12/13/2019 2100 Gross per 24 hour  Intake --  Output 600 ml  Net -600 ml    Filed Weights   12/12/19 0531 12/13/19 0500 12/14/19 0352  Weight: 94.4 kg 93.6 kg 94 kg    Examination:  General: 85 y.o. female resting in bed in NAD Cardiovascular: RRR, +S1, S2, no m/g/r Respiratory: CTABL, no w/r/r, normal WOB GI: BS+, NDNT, no masses noted MSK: No e/c/c Neuro: somnolent   Data Reviewed: I have personally reviewed following labs and imaging studies.  CBC: Recent Labs  Lab 12/10/19 0945 12/11/19 0350 12/12/19 0903 12/13/19 0435 12/14/19 0308  WBC 4.5 4.6 5.5 6.6 7.2  NEUTROABS  --   --  3.7 4.6 5.0  HGB 12.4 11.8* 12.5 12.9 12.5  HCT 37.6 36.9 39.8 40.9 39.2  MCV 99.7 100.8* 102.8* 103.3* 101.8*  PLT 206 228 217 249 270   Basic Metabolic Panel: Recent Labs  Lab 12/08/19 0322 12/10/19 0945 12/11/19 0350 12/12/19 0903 12/13/19 0435 12/14/19 0308  NA 145 145 146* 143 146* 143  K 3.2* 4.0 4.1 4.5 4.3 4.4  CL 107 111 111 108 110 108  CO2 28 24 26 23 26 26   GLUCOSE 131* 155* 159* 177* 196* 194*  BUN 11 14 16 18 21 22   CREATININE 0.66 0.71 0.73 0.78 0.77 0.74  CALCIUM 8.7* 9.0 8.9 9.1 9.1 9.2  MG 2.0  --   --  2.0 2.1 2.2  PHOS  --   --   --  3.0 3.0 3.3   GFR: Estimated Creatinine Clearance: 58.3 mL/min (by C-G formula based on SCr of 0.74 mg/dL). Liver Function Tests: Recent Labs  Lab 12/12/19 0903 12/13/19 0435 12/14/19 0308  ALBUMIN 2.6* 2.8* 2.7*   No results for input(s): LIPASE, AMYLASE in the last 168 hours. No results for input(s): AMMONIA in the last 168 hours. Coagulation Profile: No results for input(s): INR, PROTIME in the last 168 hours. Cardiac Enzymes: No results for input(s): CKTOTAL, CKMB, CKMBINDEX, TROPONINI in the last 168 hours. BNP (last 3 results) No results for input(s): PROBNP in the last 8760 hours. HbA1C: No results for input(s): HGBA1C in the last 72 hours. CBG: Recent Labs  Lab 12/13/19 2009 12/14/19 0016 12/14/19 0355 12/14/19 0754 12/14/19 1250  GLUCAP 157* 174* 162* 170* 187*   Lipid  Profile: No results for input(s): CHOL, HDL, LDLCALC, TRIG, CHOLHDL, LDLDIRECT in the last 72 hours. Thyroid Function Tests: No results for input(s): TSH, T4TOTAL, FREET4, T3FREE, THYROIDAB in the last 72 hours. Anemia Panel: Recent Labs    12/12/19 1454  VITAMINB12 698   Sepsis Labs: No results for input(s): PROCALCITON, LATICACIDVEN in the last 168 hours.  Recent Results (from the past 240 hour(s))  SARS CORONAVIRUS 2 (TAT 6-24 HRS) Nasopharyngeal Nasopharyngeal Swab     Status: None   Collection Time: 12/05/19 10:07 AM   Specimen: Nasopharyngeal Swab  Result Value Ref Range Status   SARS Coronavirus 2 NEGATIVE NEGATIVE Final    Comment: (NOTE) SARS-CoV-2 target nucleic acids are NOT DETECTED. The SARS-CoV-2 RNA is generally detectable in upper and lower respiratory specimens during the acute phase of infection. Negative results do not preclude SARS-CoV-2 infection, do not rule out co-infections with other pathogens, and should not be used as  the sole basis for treatment or other patient management decisions. Negative results must be combined with clinical observations, patient history, and epidemiological information. The expected result is Negative. Fact Sheet for Patients: HairSlick.nohttps://www.fda.gov/media/138098/download Fact Sheet for Healthcare Providers: quierodirigir.comhttps://www.fda.gov/media/138095/download This test is not yet approved or cleared by the [place] FDA and  has been authorized for detection and/or diagnosis of SARS-CoV-2 by FDA under an Emergency Use Authorization (EUA). This EUA will remain  in effect (meaning this test can be used) for the duration of the COVID-19 declaration under Section 56 4(b)(1) of the Act, 21 U.S.C. section 360bbb-3(b)(1), unless the authorization is terminated or revoked sooner. Performed at [facility], [address] [place], [place] [address]   Culture, Urine     Status: Abnormal   Collection Time: 12/06/19 11:29 AM    Specimen: Urine, Clean Catch  Result Value Ref Range Status   Specimen Description URINE, CLEAN CATCH  Final   Special Requests   Final    Normal Performed at [facility], [address] [place], [place] [address]    Culture 30,000 COLONIES/mL ENTEROCOCCUS FAECALIS (A)  Final   Report Status 12/08/2019 FINAL  Final   Organism ID, Bacteria ENTEROCOCCUS FAECALIS (A)  Final      Susceptibility   Enterococcus faecalis - MIC*    AMPICILLIN <=2 SENSITIVE Sensitive     NITROFURANTOIN <=16 SENSITIVE Sensitive     VANCOMYCIN 1 SENSITIVE Sensitive     * 30,000 COLONIES/mL ENTEROCOCCUS FAECALIS      Radiology Studies: No results found.   Scheduled Meds: . aspirin  325 mg Per Tube Daily  . calcium-vitamin D  1 tablet Oral Q breakfast  . chlorhexidine  15 mL Mouth Rinse BID  . clopidogrel  75 mg Per Tube Daily  . heparin  5,000 Units Subcutaneous Q12H  . hydrALAZINE  25 mg Oral Q8H  . levothyroxine  125 mcg Per Tube Q0600  . lisinopril  40 mg Per Tube Daily  . mouth rinse  15 mL Mouth Rinse q12n4p  . polyethylene glycol  17 g Per Tube Daily  . senna-docusate  2 tablet Per Tube BID  . simvastatin  20 mg Per Tube QPM   Continuous Infusions: . feeding supplement (JEVITY 1.2 CAL) 1,000 mL (12/14/19 0452)     LOS: 9 days    Time spent: 25 minutes spent in the coordination of care today.    Jillian Gay A Rusty Villella, DO [facility]  If 7PM-7AM, please contact night-coverage [email or web address] 12/14/2019, 2:18 PM

## 2019-12-14 NOTE — Progress Notes (Signed)
Chaplain responded to spiritual care consult.  Husband of 16 years was bedside.  Pt's eyes were open, tracked at times, but she was not directly responsive to questions.  Husband appears to be in a place of acceptance, focused on gratitude and the [place] they have shared over the decades.  He welcomed prayer, which chaplain provided.  Husband indicates pt will be going home with hospice care tomorrow.  He said the care he and his wife are receiving at [facility] has been wonderful, and he expressed thanks for the visit.    Please contact for further support if needed.   Jillian Gay [phone]    12/14/19 1400  Clinical Encounter Type  Visited With Patient and family together  Visit Type Patient actively dying  Referral From  (spiritual care consult)  Consult/Referral To Chaplain  Spiritual Encounters  Spiritual Needs Prayer  Stress Factors  Patient Stress Factors Major life changes  Family Stress Factors Major life changes

## 2019-12-14 NOTE — Progress Notes (Signed)
Manufacturing engineer Documentation   Liaison received referral for pt to return home under hospice care. Pt's chart reviewed by [facility] MD and pt was approved for hospice care in home.    Writer spoke with pt's granddaughter, Marylin Crosby, who stated that the grand kids are getting everything done today in hopes of having pt dc tomorrow. Family is very eager to get pt home for a home death. Denied any DME needs.    Please let liaison know if there are other needs and when the pt is expected to dc so that [facility] can set up an admission visit ASAP.    Pt will need to be sent home with DNR and any rxs for comfort meds.    Thank you for the referral.     Thank you,  Freddie Breech, RN  [facility] Liaison [phone]

## 2019-12-15 DIAGNOSIS — Z515 Encounter for palliative care: Secondary | ICD-10-CM

## 2019-12-15 DIAGNOSIS — I10 Essential (primary) hypertension: Secondary | ICD-10-CM

## 2019-12-15 LAB — FOLATE RBC
Folate, Hemolysate: >620 ng/mL
Folate, RBC: >1636 ng/mL (ref >498)
Hematocrit: 37.9 % (ref 34.0–46.6)

## 2019-12-15 LAB — GLUCOSE, CAPILLARY
Glucose-Capillary: 184 mg/dL — ABNORMAL HIGH (ref 70–99)
Glucose-Capillary: 180 mg/dL — ABNORMAL HIGH (ref 70–99)

## 2019-12-15 MED ORDER — LORAZEPAM 2 MG/ML PO CONC: 1.0000 mg | Freq: Three times a day (TID) | ORAL | 0 refills | Status: AC | PRN

## 2019-12-15 MED ORDER — LORAZEPAM 2 MG/ML PO CONC: 1.0000 mg | ORAL | 0 refills | Status: DC | PRN

## 2019-12-15 MED ORDER — MORPHINE SULFATE (CONCENTRATE) 20 MG/ML PO SOLN: 10.0000 mg | ORAL | 0 refills | Status: AC | PRN

## 2019-12-15 MED ORDER — MORPHINE SULFATE 20 MG/5ML PO SOLN: 2.5000 mg | ORAL | 0 refills | Status: DC | PRN

## 2019-12-15 NOTE — Progress Notes (Signed)
Patient remains somnolent. Spoke with husband who shares family made decisions over the weekend not to proceed with PEG and patient would not want to live in her current state. Jillian Gay shares he wants to spend last moments with her in the home surrounded by family. Family has requested to take patient home with outpatient hospice support.   Jillian Gay reports his children/grandchildren are at the home getting things prepared in preparation of her discharging later today.   He is appreciative of the care and support given while Jillian Gay has been hospitalized.   Hospice has been arranged outpatient with [facility].   All questions answered and support given.   Total Time: 20 min.   Greater than 50%  of this time was spent counseling and coordinating care related to the above assessment and plan.  Jillian Gay, AGPCNP-BC Palliative Medicine Team

## 2019-12-15 NOTE — TOC Transition Note (Signed)
Transition of Care [facility]) - CM/SW Discharge Note   Patient Details  Name: SHARNE LINDERS MRN: [id number] Date of Birth: [birth year]  Transition of Care [facility]) CM/SW Contact:  Eileen Stanford, LCSW Phone Number: 12/15/2019, 11:50 AM   Clinical Narrative:   [facility] has been set. Pt will d/c to home address with hospice services. RN please call granddaughter when [facility] has picked pt up.    Final next level of care: Home w Hospice Care Barriers to Discharge: No Barriers Identified   Patient Goals and CMS Choice Patient states their goals for this hospitalization and ongoing recovery are:: for Jaliyah to spend her last days and die at home CMS Medicare.gov Compare Post Acute Care list provided to:: Patient Represenative (must comment)(husband and granddaughter) Choice offered to / list presented to : Spouse, Adult Children  Discharge Placement                Patient to be transferred to facility by: [place] Name of family member notified: Leann Patient and family notified of of transfer: 12/15/19  Discharge Plan and Services   Discharge Planning Services: CM Consult Post Acute Care Choice: Hospice                               Social Determinants of Health (SDOH) Interventions     Readmission Risk Interventions No flowsheet data found.

## 2019-12-15 NOTE — Discharge Summary (Addendum)
. Physician Discharge Summary  Jillian Gay ZOX:[id number] DOB: 1932/05/09 DOA: 12/05/2019  PCP: Jillian Paradise, MD  Admit date: 12/05/2019 Discharge date: 12/15/2019  Admitted From: Home Disposition:  Discharged to home with home hospice  Recommendations for Outpatient Follow-up:  1. Follow up with hospice service  Discharge Condition: Gaurded  CODE STATUS: DNR   Brief/Interim Summary: 85 year old female with past medical history significant for CVA with residual right-sided weakness, slurred speech, hypertension, hyperlipidemia, hypothyroidism, who presented with change in mental status, unable to recognize family members since yesterday. Concern for new CVA, CT head done negative for any acute intracranial findings, showed old CVA. MRI brain ordered. Admitted for AMS/CVA work-up. MRI brain completed 12/21 and showed acute/subacute right ACA stroke. She had EEG slowing with occasional triphasic waves  12/15/19: Will be doing home with hospice today. No acute events ON    Discharge Diagnoses:  Active Problems:   Altered mental state   AMS (altered mental status)   Cerebral thrombosis with cerebral infarction  Acute/subacute right ACA CVA in the setting of prior CVA. - positive MRI brain 12/08/2019. - LDL 54, goal less than 70; continue simvastatin 20 mg daily - A1c 6.3; goal less than 7.0 - PT/OT rec CIR; Inpt Rehab eval'd, feel she is more appropriate for SNF - SLP rec n.p.o; Cortrak placed 12/21; need permanent solution, will d/w family - 2D echo with bubble study and bilateral duplex ultrasound no evidence of PFO or thrombus.  - Normal LVEF with grade 1 diastolic dysfunction. - Carotid ultrasound unremarkable - Continue plavix, aspirin 81 mg daily as recommended by neurology stroke team - 12/11/19: attempting to talk to family; unable to reach husband; spoke with dtr, deferring to husband at this time; need permanent  solution for feeds/nutrition - 12/12/19: spoke with husband yesterday Re: PEG; will need to speak with granddtr; PC onboard, appreciate assistance - 12/13/19: spoke with granddtr about SLP recommendations and PEG tube. She states that she is pretty sure that family would not want PEG placed but she needs to talk with family before making a decision. I explained that if CC/hospice is decided upon; we would work to make this happen in the home.     - 12/14/19: family has elected to take home with hospice; d/c in AM  Acute metabolic encephalopathy, likely secondary to acute/subacute right ACA CVA. - CT head showed old CVA with no acute intracranial findings - elevated TSH 6, free T4 normal - Continue home levothyroxine, per tube     - cortrak to be discontinued at discharge, therefor there is no way to deliver these meds; family does not wish to have PEG placed  T12 compression fracture - Seen on MRI spine. - Pain management in place.  Enterococcus faecalis UTI - WBC UA 11-20>>UCX grew 30,000 Enterococcus faecalis, pansensitive. -s/pampicillin  History of CVA with right sided weakness - Continue PT OT with assistance and fall precautions - Recommended CIR placement; Inpt Rehab eval'd, feel she is more appropriate for SNF     - will go home with home hospice  Dysphagia - SLP rec NPO - Cortrak placement 12/21; need permanent solution     - family has elected to forgo a PEG and have asked the cortrak be discontinued at d/c  Subclinical hypothyroidism - TSH elevated 6 - Free T4 normal - Continue levothyroxine, switched to IV due to NPO - levothyroxine per tube     - cortrak to be discontinued at discharge, therefor there is no way to  deliver these meds; family does not wish to have PEG placed  Essential hypertension - lisinopril ; add hydralazine 25 mg TID     - cortrak to be discontinued at  discharge, therefor there is no way to deliver these meds; family does not wish to have PEG placed  Hypokalemia     - resolved  Discharge Instructions   Allergies as of 12/15/2019      Reactions   Celebrex [celecoxib]    Macrodantin    Metoprolol    Norvasc [amlodipine Besylate]       Medication List    STOP taking these medications   calcitonin (salmon) 200 UNIT/ACT nasal spray Commonly known as: MIACALCIN/FORTICAL   calcium carbonate 600 MG Tabs tablet Commonly known as: OS-CAL   cholecalciferol 10 MCG (400 UNIT) Tabs tablet Commonly known as: VITAMIN D3   clopidogrel 75 MG tablet Commonly known as: PLAVIX   doxycycline 100 MG tablet Commonly known as: VIBRA-TABS   furosemide 40 MG tablet Commonly known as: LASIX   Klor-Con M20 20 MEQ tablet Generic drug: potassium chloride SA   levothyroxine 125 MCG tablet Commonly known as: SYNTHROID   lisinopril 20 MG tablet Commonly known as: ZESTRIL   multivitamin with minerals tablet   simvastatin 20 MG tablet Commonly known as: ZOCOR   vitamin C 500 MG tablet Commonly known as: ASCORBIC ACID     TAKE these medications   LORazepam 2 MG/ML concentrated solution Commonly known as: ATIVAN Take 0.5 mLs (1 mg total) by mouth every 8 (eight) hours as needed for anxiety.   morphine 20 MG/ML concentrated solution Commonly known as: ROXANOL Take 0.5 mLs (10 mg total) by mouth every 2 (two) hours as needed for severe pain.       Allergies  Allergen Reactions  . Celebrex [Celecoxib]   . Macrodantin   . Metoprolol   . Norvasc [Amlodipine Besylate]      Procedures/Studies: EEG  Result Date: 12/06/2019 Thana Farr, MD     12/06/2019  6:04 PM ELECTROENCEPHALOGRAM REPORT Patient: Jillian Gay       Room #: 6E04C EEG No. ID: [id number] Age: 85 y.o.        Sex: female Referring Physician: Margo Aye Report Date:  12/06/2019       Interpreting Physician: Thana Farr History: Jillian Gay is an 85 y.o.  female with altered mental status Medications: Rocephin, Plavix, Synthroid, Zocor Conditions of Recording:  This is a 21 channel routine scalp EEG performed with bipolar and monopolar montages arranged in accordance to the international 10/20 system of electrode placement. One channel was dedicated to EKG recording. The patient is in the awake and drowsy states. Description:  Artifact is prominent during the recording often obscuring the background rhythm. When able to be visualized the posterior background rhythm reached a maximum of 8 Hz alpha activity although for much of the recording it was actually slower with an underlying polymorphic delta activity noted that slowed the rhythm.  The patient drowses with further slowing noted. Also noted are intermittent periodic discharges of triphasic morphology. Hyperventilation and intermittent photic stimulation were not performed. IMPRESSION: This is an abnormal electroencephalogram secondary to background slowing with occasional triphasic waves consistent with an encephalopathy of nonspecific etiology.  Thana Farr, MD Neurology [phone] 12/06/2019, 5:47 PM   CT Head Wo Contrast  Result Date: 12/05/2019 CLINICAL DATA:  Weakness, altered mental status EXAM: CT HEAD WITHOUT CONTRAST TECHNIQUE: Contiguous axial images were obtained from the base of the skull through  the vertex without intravenous contrast. COMPARISON:  02/14/2010 FINDINGS: Brain: Encephalomalacia within the right frontal lobe, superior aspect of the right cerebellum, and medial left cerebellum compatible with remote prior infarcts. Additional smaller remote lacunar infarcts in the bilateral basal ganglia. Extensive low-density changes within the periventricular and subcortical white matter compatible with chronic microvascular ischemic change. Moderate-advanced diffuse cerebral volume loss. No evidence of intracranial hemorrhage. No mass or mass effect. Vascular: Mild atherosclerotic  calcifications involving the large vessels of the skull base. No unexpected hyperdense vessel. Skull: Normal. Negative for fracture or focal lesion. Sinuses/Orbits: Mucosal thickening within the inferior left maxillary sinus. Remaining paranasal sinuses are clear. Orbital structures unremarkable. Other: None. IMPRESSION: 1. No acute intracranial abnormality evident by CT. Further evaluation can be performed with MRI, as clinically indicated. 2. Advanced chronic microvascular ischemic changes and cerebral volume loss. 3. Remote prior infarcts in the right frontal lobe, superior right cerebellum, and medial left cerebellum. 4. Left maxillary sinus disease. Electronically Signed   By: Duanne Guess M.D.   On: 12/05/2019 12:02   MR BRAIN WO CONTRAST  Result Date: 12/08/2019 CLINICAL DATA:  Altered mental status. History of stroke. EXAM: MRI HEAD WITHOUT CONTRAST TECHNIQUE: Multiplanar, multiecho pulse sequences of the brain and surrounding structures were obtained without intravenous contrast. COMPARISON:  Head CT 12/05/2019 and MRI 02/14/2010 FINDINGS: Brain: There is an acute moderate-sized right ACA territory infarct involving the right paramedian frontal lobe, cingulate gyrus, and corpus callosum with a small amount of petechial hemorrhage. There is also a right ACA infarct more inferiorly in the right frontal lobe which is subacute in appearance with more confluent petechial hemorrhage. 2 adjacent punctate foci of mild hyperintensity on axial trace diffusion imaging in right temporo-occipital white matter are without corresponding reduced ADC. A chronic right parietal infarct is unchanged from the prior MRI. A moderately large right cerebellar infarct was acute on the prior MRI. Chronic left cerebellar infarcts have increased in number, and chronic infarcts in the left greater than right basal ganglia and thalami have also increased in number. There are also new chronic infarcts in the right corona radiata  and pons. There are numerous chronic microhemorrhages in the cerebellum, brainstem, basal ganglia, thalami, and both cerebral hemispheres which have increased in number from the prior MRI and are likely related to hypertension. Confluent T2 hyperintensities in the cerebral white matter bilaterally are nonspecific but compatible with severe chronic small vessel ischemic disease, progressed from the prior MRI. There is advanced cerebral and cerebellar atrophy. No mass, midline shift, or extra-axial fluid collection is identified. Vascular: Major intracranial vascular flow voids are preserved. Skull and upper cervical spine: No suspicious marrow lesion. Interbody ankylosis at C3-4 and C4-5. Sinuses/Orbits: Bilateral cataract extraction. Small left maxillary sinus mucous retention cyst. Trace left mastoid air cell fluid. Other: None. IMPRESSION: 1. Moderate-sized acute and subacute right ACA infarcts. 2. Progressive severe chronic small vessel ischemic disease with numerous chronic infarcts and microhemorrhages as above. Electronically Signed   By: Sebastian Ache M.D.   On: 12/08/2019 08:55   MR LUMBAR SPINE WO CONTRAST  Result Date: 12/08/2019 CLINICAL DATA:  Back pain. EXAM: MRI LUMBAR SPINE WITHOUT CONTRAST TECHNIQUE: Multiplanar, multisequence MR imaging of the lumbar spine was performed. No intravenous contrast was administered. COMPARISON:  Lumbar spine radiographs 11/10/2019. CT abdomen and pelvis 09/25/2017. FINDINGS: Segmentation:  Standard. Alignment: Mild-to-moderate lumbar dextroscoliosis. 3 mm retrolisthesis of L1 on L2. Vertebrae: The T12 compression fracture questioned on the prior radiographs is confirmed and demonstrates approximately 50%  vertebral body height loss which has progressed in the interim. There is marrow edema throughout the vertebral body, and there is a fluid-filled fracture cleft in the anteroinferior vertebral body. There is no retropulsion. There is mild marrow edema posteriorly  along the L1 superior endplate with a shallow Schmorl's node type deformity. There is heterogeneous fatty bone marrow signal diffusely without a suspicious focal marrow lesion identified. Multilevel chronic degenerative endplate marrow changes are present. Conus medullaris and cauda equina: Conus extends to the upper L2 level. Conus and cauda equina appear normal. Paraspinal and other soft tissues: Chronic moderate left hydronephrosis with a stone at the ureteropelvic junction as demonstrated on the 2018 CT. Bilateral renal cysts. Moderate paravertebral soft tissue edema from T11-L1 associated with the T12 fracture. Disc levels: Severe disc space narrowing greater on the left at L1-2 and L2-3 and greater on the right at L4-5 and L5-S1. T11-12: Minimal disc bulging, ligamentum flavum hypertrophy, and moderate facet arthrosis result in mild spinal stenosis and mild right neural foraminal stenosis. T12-L1: Mild disc bulging and moderate facet arthrosis without significant stenosis. L1-2: Retrolisthesis with bulging uncovered disc and mild facet and ligamentum flavum hypertrophy result in minimal left lateral recess stenosis without spinal or neural foraminal stenosis. L2-3: Mild disc bulging and endplate spurring eccentric to the left and mild facet and ligamentum flavum hypertrophy result in mild left neural foraminal stenosis without spinal stenosis. L3-4: Mild disc bulging eccentric to the left and mild facet and ligamentum flavum hypertrophy result in mild spinal stenosis and mild left neural foraminal stenosis. L4-5: Circumferential disc bulging and moderate right and mild left facet and ligamentum flavum hypertrophy result in mild right neural foraminal stenosis without significant spinal stenosis. L5-S1: Prior right laminectomy. Disc bulging, endplate spurring, severe disc space height loss, and mild facet hypertrophy result in mild right and moderate left neural foraminal stenosis without spinal stenosis.  IMPRESSION: 1. Subacute T12 compression fracture with 50% height loss, increased from prior radiographs. 2. Diffuse disc and facet degeneration with mild spinal stenosis at T11-12 and L3-4. 3. Multilevel neural foraminal stenosis, moderate on the left at L5-S1. 4. 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 Electronically Signed   By: Sebastian AcheAllen  Grady M.D.   On: 12/08/2019 09:47   DG Chest Port 1 View  Result Date: 12/05/2019 CLINICAL DATA:  Altered mental status, weakness EXAM: PORTABLE CHEST 1 VIEW COMPARISON:  02/14/2010 FINDINGS: The heart size and mediastinal contours are within normal limits. Lung volumes are low. Streaky bibasilar opacities. No pleural effusion or pneumothorax. Degenerative changes of the shoulders, right greater than left. IMPRESSION: Low lung volumes with streaky bibasilar opacities, likely atelectasis. Electronically Signed   By: Duanne GuessNicholas  Plundo M.D.   On: 12/05/2019 11:08   DG Swallowing Func-Speech Pathology  Result Date: 12/06/2019 Objective Swallowing Evaluation: Type of Study: MBS-Modified Barium Swallow Study  Patient Details Name: Jillian BattiestBetty J Gay MRN: [id number] Date of Birth: 24-Mar-1932 Today's Date: 12/06/2019 Time: SLP Start Time (ACUTE ONLY): 1325 -SLP Stop Time (ACUTE ONLY): 1356 SLP Time Calculation (min) (ACUTE ONLY): 31 min Past Medical History: Past Medical History: Diagnosis Date . Allergy  . Hypertension  . Stroke (HCC)  . Thyroid disease  Past Surgical History: No past surgical history on file. HPI: Jillian Gay is a 85 y.o. female with medical history significant of stroke with residual right-sided weakness, aphasia, slurred speech, hypertension hyperlipidemia, thyroidism, presented with running of her weakness for 7 days and change in mental status for 2 days.  Granddaughter reported choking event piror to admission.   Head CT on 12/18 reported "No acute intracranial abnormality evident by CT."  Waiting on MRI results.  CXR on 12/18  reported "Low lung volumes with streaky bibasilar opacities, likely atelectasis".  Subjective: Pt was awake/alert. Assessment / Plan / Recommendation CHL IP CLINICAL IMPRESSIONS 12/06/2019 Clinical Impression Pt presents with moderately-severe oral dysphagia and mild pharyngeal dysphagia with resultant deep laryngeal penetration of thin liquid, nectar-thick liquid, and honey-thick liquid on today's examination.  Laryngeal penetration was secondary to reduced laryngeal closure and delayed swallow iniation at the level of the pyriform sinuses.   Penetration did not always clear the laryngeal vestibule.  Of note, visibility of the laryngeal vestibule was obstructed by the pt's shoulder in this examination; therefore unable to definitively state whether trace aspiration occurred; however, pt is at an elevated risk for aspiration secondary to penetration not clearing the laryngeal vestibule.  Oral phase was remarkable for bolus holding with all consistencies, benefited by frequent presentation of a dry spoon to cue pt for AP transport and swallow initiation.  Despite cues, pt with moderate amounts of residue in her oral cavity at the completion of this examination.  Oral residue increased with increased viscosity of the trials, with thin liquid clearing the best.  She additionally exhibited anterior labial spillage, reduced lingual control resulting in premature spillage to the pyriform sinuses, and reduced lingual strength resulting in oral residue.  Suspect that cognitive-linguistic deficits impacted the pt's oral phase of the swallow.  Pharyngeal phase was remarkable for mildly reduced BOT retraction and reduced hyolaryngeal elevation/excursion resulting in trace vallecular residue.  Recommend NPO with frequent oral care and consideration of short-term alternative means of nutrition.  Pt may have a few small ice chips or small spoon sips of thin liquid following thorough oral care to mobilize oropharyngeal swallow  musculature and to help keep oral cavity moistened.  She may also have essential meds crushed in puree with use of dry spoon, frequent cues for the pt to clear her oral cavity, and oral suction following administration. SLP Visit Diagnosis Dysphagia, oropharyngeal phase (R13.12) Attention and concentration deficit following -- Frontal lobe and executive function deficit following -- Impact on safety and function Moderate aspiration risk   CHL IP TREATMENT RECOMMENDATION 12/06/2019 Treatment Recommendations Therapy as outlined in treatment plan below   Prognosis 12/06/2019 Prognosis for Safe Diet Advancement Fair Barriers to Reach Goals Language deficits Barriers/Prognosis Comment -- CHL IP DIET RECOMMENDATION 12/06/2019 SLP Diet Recommendations NPO;Alternative means - temporary;Ice chips PRN after oral care Liquid Administration via Spoon Medication Administration Via alternative means Compensations Small sips/bites;Slow rate;Lingual sweep for clearance of pocketing;Multiple dry swallows after each bite/sip Postural Changes Seated upright at 90 degrees   CHL IP OTHER RECOMMENDATIONS 12/06/2019 Recommended Consults -- Oral Care Recommendations Oral care QID;Staff/trained caregiver to provide oral care Other Recommendations Have oral suction available   CHL IP FOLLOW UP RECOMMENDATIONS 12/06/2019 Follow up Recommendations Skilled Nursing facility;24 hour supervision/assistance   CHL IP FREQUENCY AND DURATION 12/06/2019 Speech Therapy Frequency (ACUTE ONLY) min 2x/week Treatment Duration 2 weeks      CHL IP ORAL PHASE 12/06/2019 Oral Phase Impaired Oral - Pudding Teaspoon -- Oral - Pudding Cup -- Oral - Honey Teaspoon Weak lingual manipulation;Incomplete tongue to palate contact;Reduced posterior propulsion;Holding of bolus;Pocketing in anterior sulcus;Lingual/palatal residue;Piecemeal swallowing;Delayed oral transit;Decreased bolus cohesion;Premature spillage Oral - Honey Cup -- Oral - Nectar Teaspoon Weak lingual  manipulation;Incomplete tongue to palate contact;Reduced posterior propulsion;Holding of bolus;Pocketing in anterior  sulcus;Lingual/palatal residue;Piecemeal swallowing;Delayed oral transit;Decreased bolus cohesion;Premature spillage Oral - Nectar Cup -- Oral - Nectar Straw -- Oral - Thin Teaspoon Reduced posterior propulsion;Delayed oral transit;Premature spillage;Lingual/palatal residue Oral - Thin Cup -- Oral - Thin Straw Weak lingual manipulation;Incomplete tongue to palate contact;Reduced posterior propulsion;Holding of bolus;Pocketing in anterior sulcus;Lingual/palatal residue;Piecemeal swallowing;Delayed oral transit;Decreased bolus cohesion;Premature spillage Oral - Puree Weak lingual manipulation;Incomplete tongue to palate contact;Reduced posterior propulsion;Holding of bolus;Right pocketing in lateral sulci;Left pocketing in lateral sulci;Pocketing in anterior sulcus;Lingual/palatal residue;Piecemeal swallowing;Delayed oral transit;Decreased bolus cohesion;Premature spillage Oral - Mech Soft -- Oral - Regular -- Oral - Multi-Consistency -- Oral - Pill -- Oral Phase - Comment --  CHL IP PHARYNGEAL PHASE 12/06/2019 Pharyngeal Phase Impaired Pharyngeal- Pudding Teaspoon -- Pharyngeal -- Pharyngeal- Pudding Cup -- Pharyngeal -- Pharyngeal- Honey Teaspoon Penetration/Aspiration before swallow;Reduced tongue base retraction;Reduced airway/laryngeal closure;Reduced laryngeal elevation;Reduced anterior laryngeal mobility;Delayed swallow initiation-pyriform sinuses Pharyngeal Material enters airway, remains ABOVE vocal cords and not ejected out Pharyngeal- Honey Cup -- Pharyngeal -- Pharyngeal- Nectar Teaspoon Delayed swallow initiation-pyriform sinuses;Reduced anterior laryngeal mobility;Reduced laryngeal elevation;Reduced airway/laryngeal closure;Reduced tongue base retraction;Penetration/Aspiration before swallow;Pharyngeal residue - valleculae Pharyngeal Material enters airway, remains ABOVE vocal cords and  not ejected out Pharyngeal- Nectar Cup -- Pharyngeal -- Pharyngeal- Nectar Straw -- Pharyngeal -- Pharyngeal- Thin Teaspoon Delayed swallow initiation-pyriform sinuses;Reduced anterior laryngeal mobility;Reduced laryngeal elevation;Reduced airway/laryngeal closure;Reduced tongue base retraction;Penetration/Aspiration before swallow Pharyngeal Material enters airway, remains ABOVE vocal cords and not ejected out Pharyngeal- Thin Cup -- Pharyngeal -- Pharyngeal- Thin Straw Delayed swallow initiation-pyriform sinuses;Reduced anterior laryngeal mobility;Reduced laryngeal elevation;Reduced airway/laryngeal closure;Reduced tongue base retraction;Penetration/Aspiration before swallow Pharyngeal Material enters airway, remains ABOVE vocal cords then ejected out Pharyngeal- Puree Delayed swallow initiation-pyriform sinuses;Reduced anterior laryngeal mobility;Reduced laryngeal elevation;Reduced tongue base retraction;Pharyngeal residue - valleculae Pharyngeal Material does not enter airway Pharyngeal- Mechanical Soft -- Pharyngeal -- Pharyngeal- Regular -- Pharyngeal -- Pharyngeal- Multi-consistency -- Pharyngeal -- Pharyngeal- Pill -- Pharyngeal -- Pharyngeal Comment --  No flowsheet data found. Villa Herb., M.S., CCC-SLP Acute Rehabilitation Services Office: [phone] Shanon Rosser [facility] 12/06/2019, 3:54 PM              ECHOCARDIOGRAM COMPLETE BUBBLE STUDY  Result Date: 12/08/2019   ECHOCARDIOGRAM REPORT   Patient Name:   Jillian Gay Date of Exam: 12/08/2019 Medical Rec #:  [id number]       Height:       65.0 in Accession #:    [id number]      Weight:       203.6 lb Date of Birth:  08/22/1932       BSA:          1.99 m Patient Age:    87 years        BP:           160/61 mmHg Patient Gender: F               HR:           64 bpm. Exam Location:  Inpatient Procedure: 2D Echo and Saline Contrast Bubble Study Indications:    Stroke I63.9  History:        Patient has no prior history of Echocardiogram examinations.                  Risk Factors:Hypertension.  Sonographer:    Thurman Coyer RDCS (AE) Referring Phys: [id number] CAROLE N HALL IMPRESSIONS  1. Left ventricular ejection fraction, by visual estimation, is 60 to 65%. The left ventricle has normal function.  There is no left ventricular hypertrophy.  2. Left ventricular diastolic parameters are consistent with Grade I diastolic dysfunction (impaired relaxation).  3. The left ventricle has no regional wall motion abnormalities.  4. Global right ventricle has normal systolic function.The right ventricular size is normal. No increase in right ventricular wall thickness.  5. Left atrial size was moderately dilated.  6. Right atrial size was normal.  7. Mild mitral annular calcification.  8. The mitral valve is normal in structure. Mild mitral valve regurgitation. No evidence of mitral stenosis.  9. The tricuspid valve is normal in structure. Tricuspid valve regurgitation is mild. 10. The aortic valve is normal in structure. Aortic valve regurgitation is not visualized. No evidence of aortic valve sclerosis or stenosis. 11. The pulmonic valve was normal in structure. Pulmonic valve regurgitation is not visualized. 12. Mildly elevated pulmonary artery systolic pressure. 13. The inferior vena cava is normal in size with greater than 50% respiratory variability, suggesting right atrial pressure of 3 mmHg. 14. No intracardiac thrombi or masses were visualized. FINDINGS  Left Ventricle: Left ventricular ejection fraction, by visual estimation, is 60 to 65%. The left ventricle has normal function. The left ventricle has no regional wall motion abnormalities. There is no left ventricular hypertrophy. Left ventricular diastolic parameters are consistent with Grade I diastolic dysfunction (impaired relaxation). Normal left atrial pressure. Right Ventricle: The right ventricular size is normal. No increase in right ventricular wall thickness. Global RV systolic function is has normal systolic  function. The tricuspid regurgitant velocity is 2.70 m/s, and with an assumed right atrial pressure  of 8 mmHg, the estimated right ventricular systolic pressure is mildly elevated at 37.2 mmHg. Left Atrium: Left atrial size was moderately dilated. Right Atrium: Right atrial size was normal in size Pericardium: There is no evidence of pericardial effusion. Mitral Valve: The mitral valve is normal in structure. Mild mitral annular calcification. Mild mitral valve regurgitation. No evidence of mitral valve stenosis by observation. Tricuspid Valve: The tricuspid valve is normal in structure. Tricuspid valve regurgitation is mild. Aortic Valve: The aortic valve is normal in structure. Aortic valve regurgitation is not visualized. The aortic valve is structurally normal, with no evidence of sclerosis or stenosis. Pulmonic Valve: The pulmonic valve was normal in structure. Pulmonic valve regurgitation is not visualized. Pulmonic regurgitation is not visualized. Aorta: The aortic root, ascending aorta and aortic arch are all structurally normal, with no evidence of dilitation or obstruction. Venous: The inferior vena cava is normal in size with greater than 50% respiratory variability, suggesting right atrial pressure of 3 mmHg. IAS/Shunts: No atrial level shunt detected by color flow Doppler. Agitated saline contrast was given intravenously to evaluate for intracardiac shunting. Saline contrast bubble study was negative, with no evidence of any interatrial shunt. There is no evidence of a patent foramen ovale. No ventricular septal defect is seen or detected. There is no evidence of an atrial septal defect. Additional Comments: No intracardiac thrombi or masses were visualized.  LEFT VENTRICLE PLAX 2D LVIDd:         4.30 cm  Diastology LVIDs:         3.20 cm  LV e' lateral:   9.14 cm/s LV PW:         1.10 cm  LV E/e' lateral: 8.9 LV IVS:        1.10 cm  LV e' medial:    5.33 cm/s LVOT diam:     2.20 cm  LV E/e' medial:   15.3 LV  SV:         42 ml LV SV Index:   20.10 LVOT Area:     3.80 cm  RIGHT VENTRICLE RV S prime:     15.30 cm/s TAPSE (M-mode): 2.8 cm LEFT ATRIUM             Index       RIGHT ATRIUM           Index LA diam:        4.90 cm 2.46 cm/m  RA Area:     18.40 cm LA Vol (A2C):   94.5 ml 47.41 ml/m RA Volume:   48.90 ml  24.53 ml/m LA Vol (A4C):   60.3 ml 30.25 ml/m LA Biplane Vol: 75.6 ml 37.93 ml/m  AORTIC VALVE LVOT Vmax:   91.70 cm/s LVOT Vmean:  57.200 cm/s LVOT VTI:    0.213 m  AORTA Ao Root diam: 2.90 cm MITRAL VALVE                        TRICUSPID VALVE MV Area (PHT): 2.80 cm             TR Peak grad:   29.2 mmHg MV PHT:        78.59 msec           TR Vmax:        270.00 cm/s MV Decel Time: 271 msec MR Peak grad: 88.0 mmHg             SHUNTS MR Vmax:      469.00 cm/s           Systemic VTI:  0.21 m MV E velocity: 81.80 cm/s 103 cm/s  Systemic Diam: 2.20 cm MV A velocity: 81.80 cm/s 70.3 cm/s MV E/A ratio:  1.00       1.5  Donato Schultz MD Electronically signed by Donato Schultz MD Signature Date/Time: 12/08/2019/4:26:09 PM    Final    carotid doppler/carotid US  Result Date: 12/09/2019 Carotid Arterial Duplex Study Indications:       CVA. Risk Factors:      Prior CVA. Limitations        Today's exam was limited due to the patient's inability or                    unwillingness to cooperate. Comparison Study:  no prior Performing Technologist: Blanch Media RVS  Examination Guidelines: A complete evaluation includes B-mode imaging, spectral Doppler, color Doppler, and power Doppler as needed of all accessible portions of each vessel. Bilateral testing is considered an integral part of a complete examination. Limited examinations for reoccurring indications may be performed as noted.  Right Carotid Findings: +----------+--------+--------+--------+------------------+--------+           PSV cm/sEDV cm/sStenosisPlaque DescriptionComments +----------+--------+--------+--------+------------------+--------+  CCA Prox  40      7               heterogenous               +----------+--------+--------+--------+------------------+--------+ CCA Distal45      7               heterogenous               +----------+--------+--------+--------+------------------+--------+ ICA Prox  50      12      1-39%   heterogenous               +----------+--------+--------+--------+------------------+--------+ ICA Distal45  7                                          +----------+--------+--------+--------+------------------+--------+ ECA       77      6                                          +----------+--------+--------+--------+------------------+--------+ +----------+--------+-------+--------+-------------------+           PSV cm/sEDV cmsDescribeArm Pressure (mmHG) +----------+--------+-------+--------+-------------------+ [id number]                                         +----------+--------+-------+--------+-------------------+ +---------+--------+--+--------+-+---------+ VertebralPSV cm/s55EDV cm/s7Antegrade +---------+--------+--+--------+-+---------+  Left Carotid Findings: +----------+--------+--------+--------+------------------+--------------+           PSV cm/sEDV cm/sStenosisPlaque DescriptionComments       +----------+--------+--------+--------+------------------+--------------+ CCA Prox  45      8               heterogenous                     +----------+--------+--------+--------+------------------+--------------+ CCA Distal47      6               heterogenous                     +----------+--------+--------+--------+------------------+--------------+ ICA Prox  63      11      1-39%   heterogenous                     +----------+--------+--------+--------+------------------+--------------+ ICA Distal                                          Not visualized +----------+--------+--------+--------+------------------+--------------+  ECA       132     7                                                +----------+--------+--------+--------+------------------+--------------+ +----------+--------+--------+--------+-------------------+           PSV cm/sEDV cm/sDescribeArm Pressure (mmHG) +----------+--------+--------+--------+-------------------+ [id number]                                          +----------+--------+--------+--------+-------------------+ +---------+--------+--+--------+-+---------+ VertebralPSV cm/s19EDV cm/s5Antegrade +---------+--------+--+--------+-+---------+  Summary: Right Carotid: Velocities in the right ICA are consistent with a 1-39% stenosis. Left Carotid: Velocities in the left ICA are consistent with a 1-39% stenosis. Vertebrals: Bilateral vertebral arteries demonstrate antegrade flow. *See table(s) above for measurements and observations.  Electronically signed by Antony Contras MD on 12/09/2019 at 12:02:15 PM.    Final    VAS [place] TRANSCRANIAL DOPPLER  Result Date: 12/10/2019  Transcranial Doppler Indications: Stroke. Limitations: poor windows Limitations for diagnostic windows: Unable to insonate right transtemporal window. Unable to insonate left transtemporal window. Unable to insonate occipital window. Performing Technologist: June Leap RDMS, RVT  Examination Guidelines: A  complete evaluation includes B-mode imaging, spectral Doppler, color Doppler, and power Doppler as needed of all accessible portions of each vessel. Bilateral testing is considered an integral part of a complete examination. Limited examinations for reoccurring indications may be performed as noted.  +----------+-------------+----------+-----------+-------+ RIGHT TCD Right VM (cm)Depth (cm)PulsatilityComment +----------+-------------+----------+-----------+-------+ Opthalmic     24.00                 1.41            +----------+-------------+----------+-----------+-------+ ICA siphon    43.00                  1.06            +----------+-------------+----------+-----------+-------+  +----------+------------+----------+-----------+-------+ LEFT TCD  Left VM (cm)Depth (cm)PulsatilityComment +----------+------------+----------+-----------+-------+ Opthalmic    25.00                 1.26            +----------+------------+----------+-----------+-------+ ICA siphon   -62.00                1.22            +----------+------------+----------+-----------+-------+  Summary:  suboptimal study due to absent bitemporal and suboccipital windows. Nomral blood flow directions and mean flow velocities in both opthalics and carotid siphons. *See table(s) above for measurements and observations.  Diagnosing physician: Delia Heady MD Electronically signed by Delia Heady MD on 12/10/2019 at 12:12:27 PM.    Final       Subjective: No acute events ON.   Discharge Exam: Vitals:   12/15/19 0426 12/15/19 0748  BP: (!) 122/51 (!) 130/49  Pulse: 74 76  Resp: 19   Temp: 99.7 F (37.6 C) 99.5 F (37.5 C)  SpO2: 95% 95%   Vitals:   12/14/19 1947 12/15/19 0020 12/15/19 0426 12/15/19 0748  BP: (!) 158/51 (!) 123/50 (!) 122/51 (!) 130/49  Pulse: 78 73 74 76  Resp: 18  19   Temp: 100 F (37.8 C) 99.4 F (37.4 C) 99.7 F (37.6 C) 99.5 F (37.5 C)  TempSrc: Axillary Axillary Axillary Oral  SpO2: 93% 94% 95% 95%  Weight:   94 kg   Height:        General: 85 y.o. ill appearing female resting in bed Cardiovascular: RRR, +S1, S2, no m/g/r, equal pulses throughout Respiratory: CTABL, no w/r/r, normal WOB GI: BS+, NDNT, soft MSK: No e/c/c Neuro: somnolent   The results of significant diagnostics from this hospitalization (including imaging, microbiology, ancillary and laboratory) are listed below for reference.     Microbiology: Recent Results (from the past 240 hour(s))  Culture, Urine     Status: Abnormal   Collection Time: 12/06/19 11:29 AM   Specimen: Urine, Clean Catch   Result Value Ref Range Status   Specimen Description URINE, CLEAN CATCH  Final   Special Requests   Final    Normal Performed at [facility], [address] [place], [place] [address]    Culture 30,000 COLONIES/mL ENTEROCOCCUS FAECALIS (A)  Final   Report Status 12/08/2019 FINAL  Final   Organism ID, Bacteria ENTEROCOCCUS FAECALIS (A)  Final      Susceptibility   Enterococcus faecalis - MIC*    AMPICILLIN <=2 SENSITIVE Sensitive     NITROFURANTOIN <=16 SENSITIVE Sensitive     VANCOMYCIN 1 SENSITIVE Sensitive     * 30,000 COLONIES/mL ENTEROCOCCUS FAECALIS     Labs: BNP (last 3 results) No results for input(s): BNP in  the last 8760 hours. Basic Metabolic Panel: Recent Labs  Lab 12/10/19 0945 12/11/19 0350 12/12/19 0903 12/13/19 0435 12/14/19 0308  NA 145 146* 143 146* 143  K 4.0 4.1 4.5 4.3 4.4  CL 111 111 108 110 108  CO2 24 26 23 26 26   GLUCOSE 155* 159* 177* 196* 194*  Gay 14 16 18 21 22   CREATININE 0.71 0.73 0.78 0.77 0.74  CALCIUM 9.0 8.9 9.1 9.1 9.2  MG  --   --  2.0 2.1 2.2  PHOS  --   --  3.0 3.0 3.3   Liver Function Tests: Recent Labs  Lab 12/12/19 0903 12/13/19 0435 12/14/19 0308  ALBUMIN 2.6* 2.8* 2.7*   No results for input(s): LIPASE, AMYLASE in the last 168 hours. No results for input(s): AMMONIA in the last 168 hours. CBC: Recent Labs  Lab 12/10/19 0945 12/11/19 0350 12/12/19 0903 12/12/19 1454 12/13/19 0435 12/14/19 0308  WBC 4.5 4.6 5.5  --  6.6 7.2  NEUTROABS  --   --  3.7  --  4.6 5.0  HGB 12.4 11.8* 12.5  --  12.9 12.5  HCT 37.6 36.9 39.8 37.9 40.9 39.2  MCV 99.7 100.8* 102.8*  --  103.3* 101.8*  PLT 206 228 217  --  249 245   Cardiac Enzymes: No results for input(s): CKTOTAL, CKMB, CKMBINDEX, TROPONINI in the last 168 hours. BNP: Invalid input(s): POCBNP CBG: Recent Labs  Lab 12/14/19 0754 12/14/19 1250 12/14/19 1705 12/14/19 1946 12/15/19 0749  GLUCAP 170* 187* 155* 163* 184*   D-Dimer No results for  input(s): DDIMER in the last 72 hours. Hgb A1c No results for input(s): HGBA1C in the last 72 hours. Lipid Profile No results for input(s): CHOL, HDL, LDLCALC, TRIG, CHOLHDL, LDLDIRECT in the last 72 hours. Thyroid function studies No results for input(s): TSH, T4TOTAL, T3FREE, THYROIDAB in the last 72 hours.  Invalid input(s): FREET3 Anemia work up Recent Labs    12/12/19 1454  VITAMINB12 698   Urinalysis    Component Value Date/Time   COLORURINE YELLOW 12/05/2019 1600   APPEARANCEUR CLEAR 12/05/2019 1600   LABSPEC 1.013 12/05/2019 1600   PHURINE 6.0 12/05/2019 1600   GLUCOSEU NEGATIVE 12/05/2019 1600   HGBUR NEGATIVE 12/05/2019 1600   BILIRUBINUR NEGATIVE 12/05/2019 1600   KETONESUR NEGATIVE 12/05/2019 1600   PROTEINUR NEGATIVE 12/05/2019 1600   UROBILINOGEN 0.2 03/02/2010 1143   NITRITE NEGATIVE 12/05/2019 1600   LEUKOCYTESUR TRACE (A) 12/05/2019 1600   Sepsis Labs Invalid input(s): PROCALCITONIN,  WBC,  LACTICIDVEN Microbiology Recent Results (from the past 240 hour(s))  Culture, Urine     Status: Abnormal   Collection Time: 12/06/19 11:29 AM   Specimen: Urine, Clean Catch  Result Value Ref Range Status   Specimen Description URINE, CLEAN CATCH  Final   Special Requests   Final    Normal Performed at [facility], [address] [place], [address] [place]    Culture 30,000 COLONIES/mL ENTEROCOCCUS FAECALIS (A)  Final   Report Status 12/08/2019 FINAL  Final   Organism ID, Bacteria ENTEROCOCCUS FAECALIS (A)  Final      Susceptibility   Enterococcus faecalis - MIC*    AMPICILLIN <=2 SENSITIVE Sensitive     NITROFURANTOIN <=16 SENSITIVE Sensitive     VANCOMYCIN 1 SENSITIVE Sensitive     * 30,000 COLONIES/mL ENTEROCOCCUS FAECALIS     Time coordinating discharge: 35 minutes  SIGNED:   [id number], DO  [facility] 12/15/2019, 11:19 AM  If 7PM-7AM, please contact night-coverage [email or web address]

## 2019-12-15 NOTE — Progress Notes (Signed)
PT Cancellation Note  Patient Details Name: Jillian Gay MRN: [id number] DOB: 11-22-1932   Cancelled Treatment:    Reason Eval/Treat Not Completed: Other (comment) per chart review, patient's family has opted for home with hospice and is aware of grim prognosis. Appears that she will be likely discharging home this morning. Holding PT to allow for team focus on DC and family needs. Continue to recommend equipment as in PT notes.  If patient has urgent rehab needs, please direct message this therapist in [place] (8am-4pm) or call acute rehab office at number below.     Windell Norfolk, DPT, PN1   Supplemental Physical Therapist [facility]    Pager [phone] Acute Rehab Office [phone]

## 2019-12-15 NOTE — TOC Progression Note (Signed)
Transition of Care [facility]) - Progression Note    Patient Details  Name: Jillian Gay MRN: [id number] Date of Birth: [birth year]  [facility] of [facility] CM/SW Contact  Eileen Stanford, LCSW Phone Number: 12/15/2019, 11:22 AM  Clinical Narrative:   CSW has confirmed with hospice and palliative care of [facility] that everything is arranged for pt to d/c home with hospice services. CSW will set up PTAR once d/c is complete.    Expected Discharge Plan: [place]    Expected Discharge Plan and Services Expected Discharge Plan: [place]   Discharge Planning Services: CM Consult Post Acute Care Choice: Hospice Living arrangements for the past 2 months: Single Family Home                                       Social Determinants of Health (SDOH) Interventions    Readmission Risk Interventions No flowsheet data found.

## 2019-12-16 LAB — GLUCOSE, CAPILLARY: Glucose-Capillary: 182 mg/dL — ABNORMAL HIGH (ref 70–99)

## 2020-01-19 DEATH — deceased

## 2020-09-01 IMAGING — MR MR LUMBAR SPINE W/O CM
4 of 5 series · 18 of 48 positions shown · non-contrast
Comparison: Lumbar spine radiographs 11/10/2019. CT abdomen and
pelvis 09/25/2017.

CLINICAL DATA: Back pain.

EXAM:
MRI LUMBAR SPINE WITHOUT CONTRAST
TECHNIQUE: Multiplanar, multisequence MR imaging of the lumbar spine was
performed. No intravenous contrast was administered.

[Series 2: T2 · sagittal · 4.0mm · 0.55mm/px · 6 of 17 slices shown (1 of 2)]
[im 1/17]
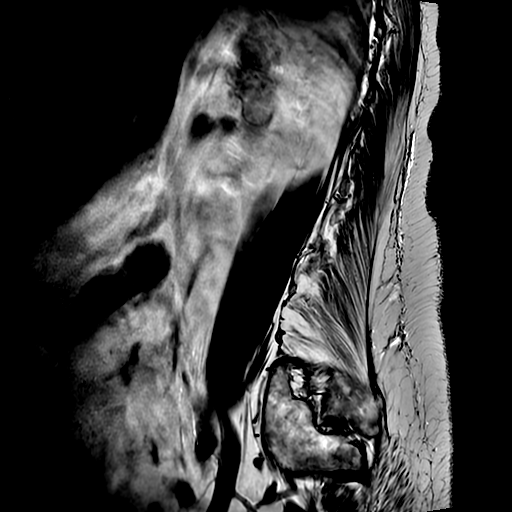
[im 4/17]
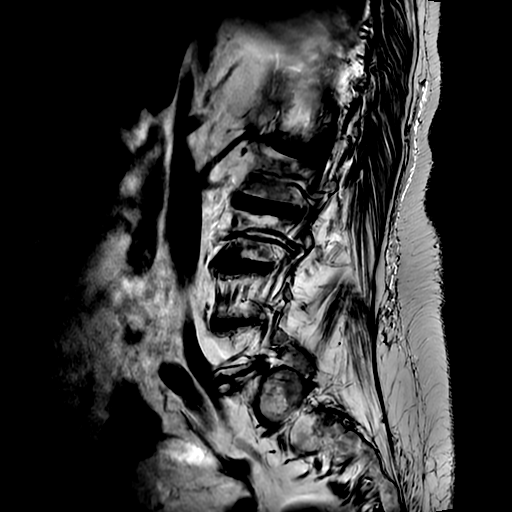
[im 7/17]
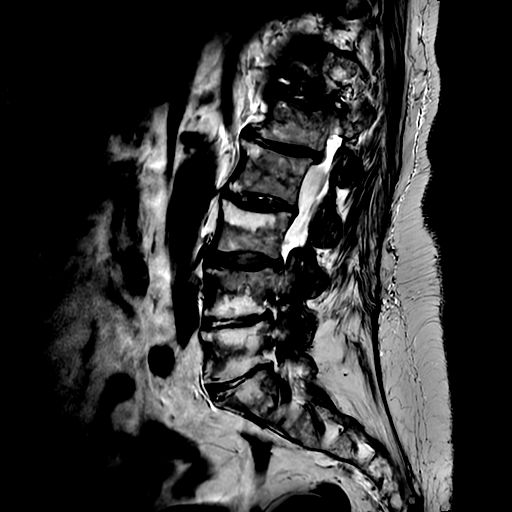
[im 10/17]
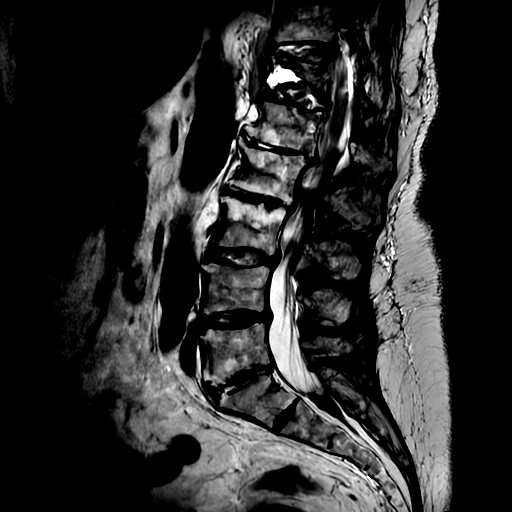
[im 13/17]
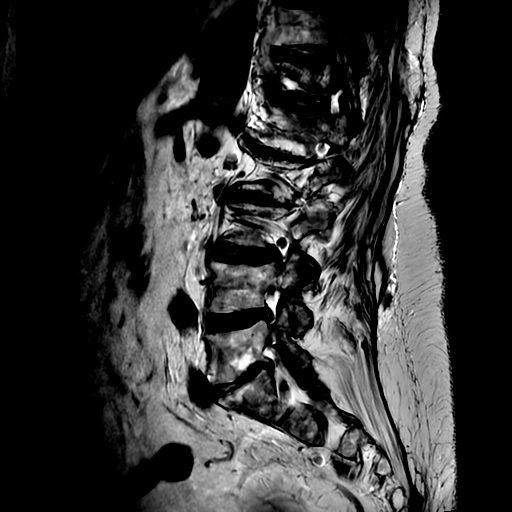
[im 17/17]
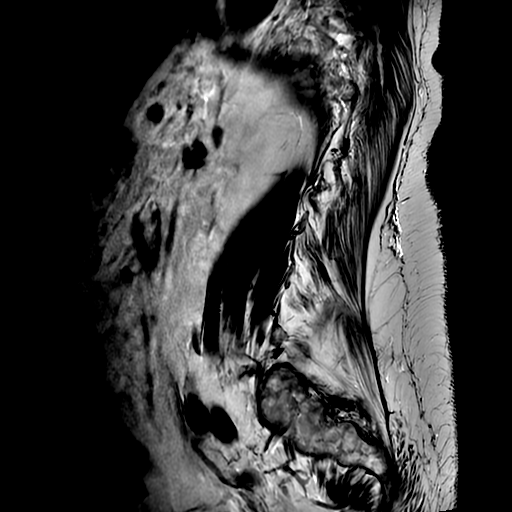

[Series 4: T1 · sagittal · 4.0mm · 0.55mm/px · 3 of 17 slices shown (1 of 2)]
[im 4/17]
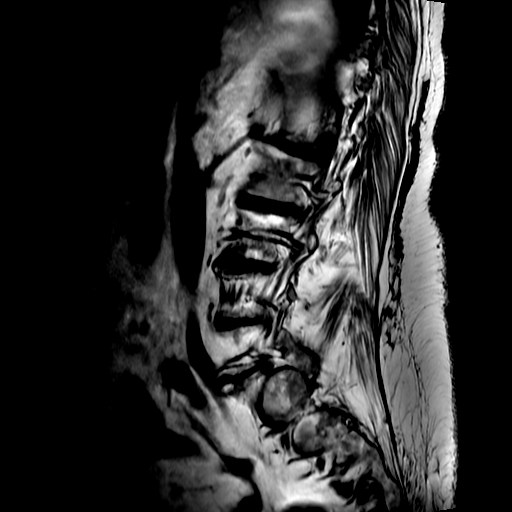
[im 10/17]
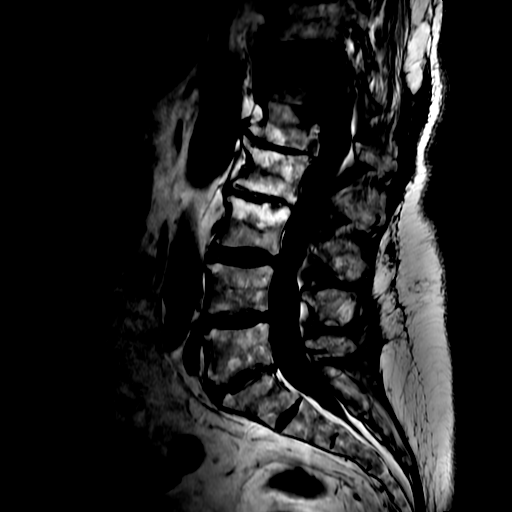
[im 17/17]
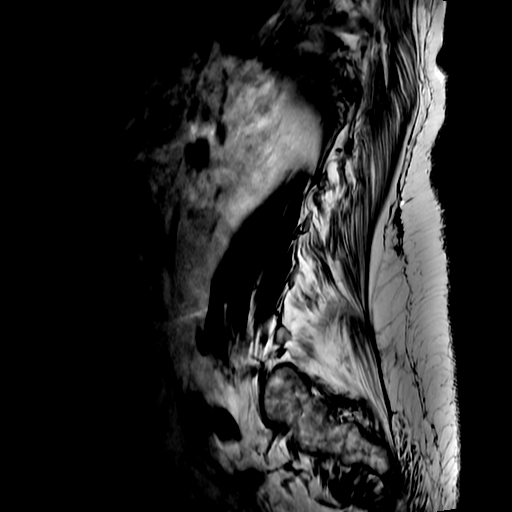

[Series 5: T2 · axial · 4.0mm · 0.39mm/px · z∈[+36,+218]mm · 6 of 45 slices shown (2 of 2)]
[im 1/45]
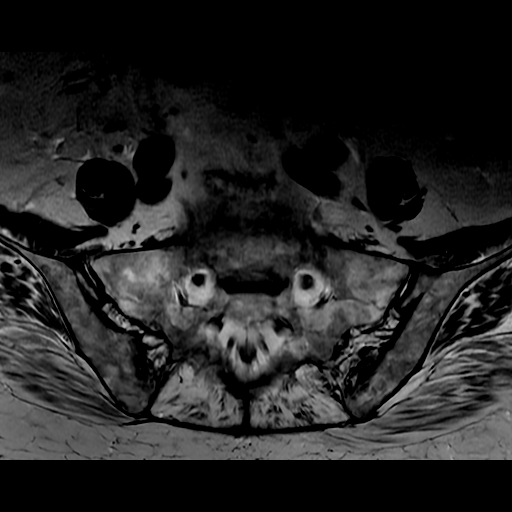
[im 7/45]
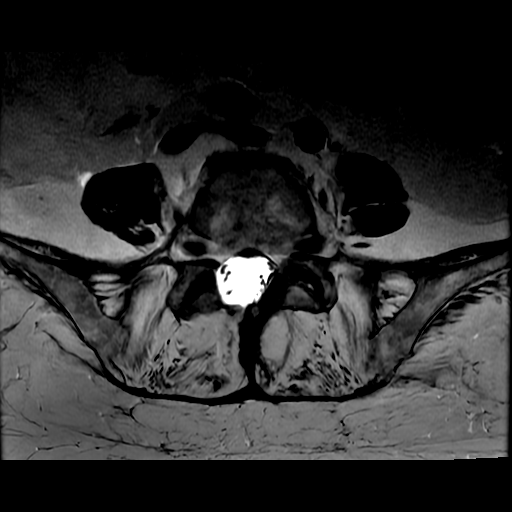
[im 13/45]
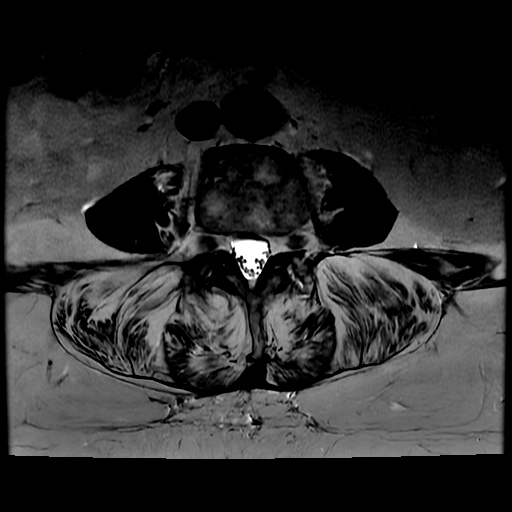
[im 19/45]
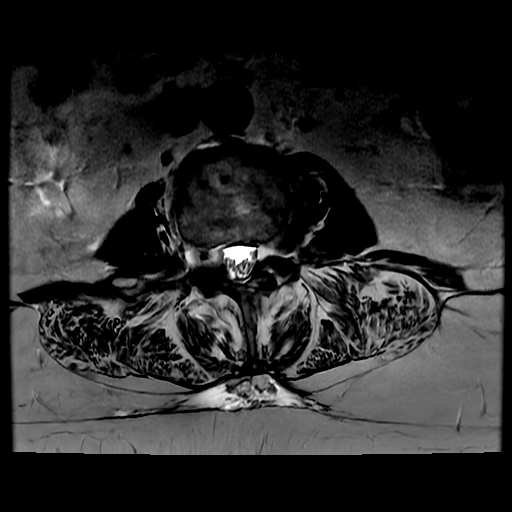
[im 23/45]
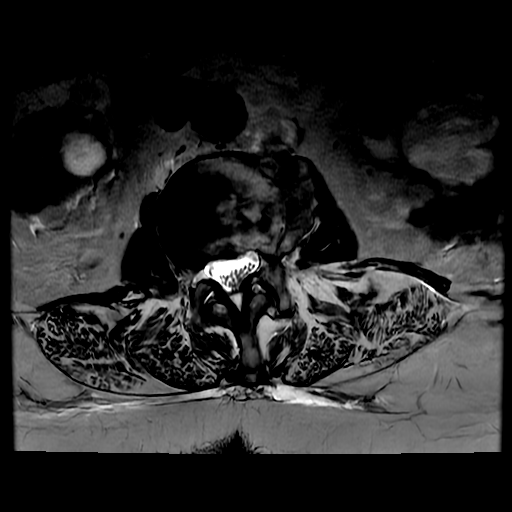
[im 38/45]
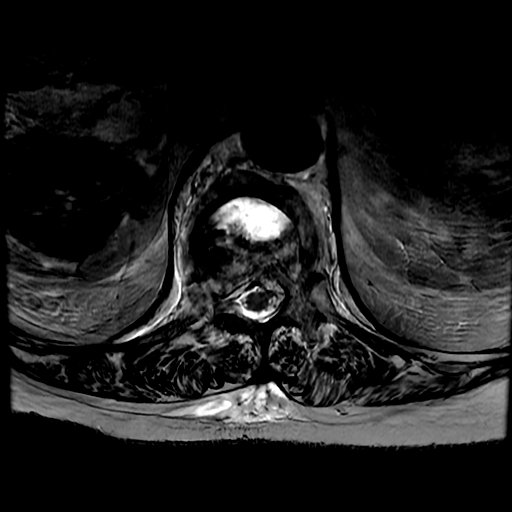

[Series 6: T1 · axial · 4.0mm · 0.39mm/px · z∈[+66,+218]mm · 3 of 45 slices shown (2 of 2)]
[im 7/45]
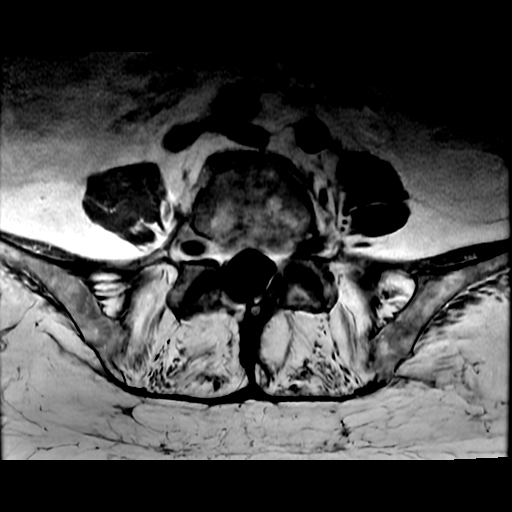
[im 23/45]
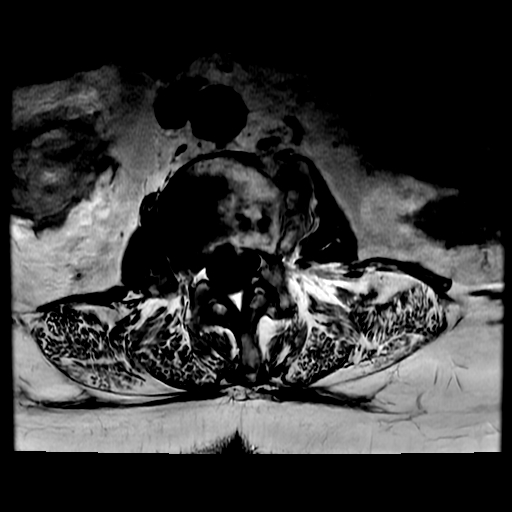
[im 38/45]
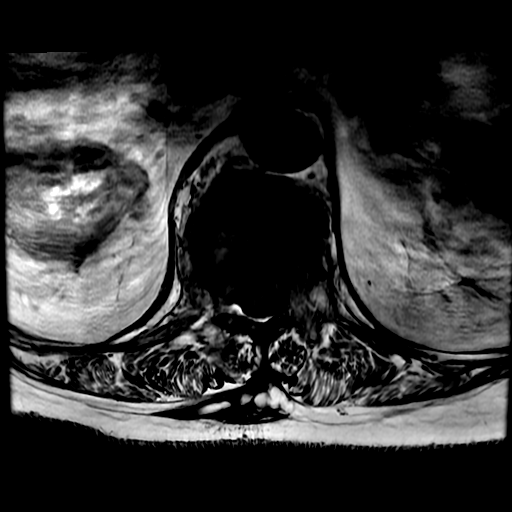

[18 of 48 positions shown; findings below may reference images not displayed]

FINDINGS: Segmentation:  Standard.

Alignment: Mild-to-moderate lumbar dextroscoliosis. 3 mm
retrolisthesis of L1 on L2.

Vertebrae: The T12 compression fracture questioned on the prior
radiographs is confirmed and demonstrates approximately 50%
vertebral body height loss which has progressed in the interim.
There is marrow edema throughout the vertebral body, and there is a
fluid-filled fracture cleft in the anteroinferior vertebral body.
There is no retropulsion. There is mild marrow edema posteriorly
along the L1 superior endplate with a shallow Schmorl's node type
deformity. There is heterogeneous fatty bone marrow signal diffusely
without a suspicious focal marrow lesion identified. Multilevel
chronic degenerative endplate marrow changes are present.

Conus medullaris and cauda equina: Conus extends to the upper L2
level. Conus and cauda equina appear normal.

Paraspinal and other soft tissues: Chronic moderate left
hydronephrosis with a stone at the ureteropelvic junction as
demonstrated on the 9833 CT. Bilateral renal cysts. Moderate
paravertebral soft tissue edema from T11-L1 associated with the T12
fracture.

Disc levels:

Severe disc space narrowing greater on the left at L1-2 and L2-3 and
greater on the right at L4-5 and L5-S1.

T11-12: Minimal disc bulging, ligamentum flavum hypertrophy, and
moderate facet arthrosis result in mild spinal stenosis and mild
right neural foraminal stenosis.

T12-L1: Mild disc bulging and moderate facet arthrosis without
significant stenosis.

L1-2: Retrolisthesis with bulging uncovered disc and mild facet and
ligamentum flavum hypertrophy result in minimal left lateral recess
stenosis without spinal or neural foraminal stenosis.

L2-3: Mild disc bulging and endplate spurring eccentric to the left
and mild facet and ligamentum flavum hypertrophy result in mild left
neural foraminal stenosis without spinal stenosis.

L3-4: Mild disc bulging eccentric to the left and mild facet and
ligamentum flavum hypertrophy result in mild spinal stenosis and
mild left neural foraminal stenosis.

L4-5: Circumferential disc bulging and moderate right and mild left
facet and ligamentum flavum hypertrophy result in mild right neural
foraminal stenosis without significant spinal stenosis.

L5-S1: Prior right laminectomy. Disc bulging, endplate spurring,
severe disc space height loss, and mild facet hypertrophy result in
mild right and moderate left neural foraminal stenosis without
spinal stenosis.
IMPRESSION: 1. Subacute T12 compression fracture with 50% height loss, increased
from prior radiographs.
2. Diffuse disc and facet degeneration with mild spinal stenosis at
T11-12 and L3-4.
3. Multilevel neural foraminal stenosis, moderate on the left at
L5-S1.
4. Chronic moderate left hydronephrosis related to a UPJ calculus.
